# Patient Record
Sex: Female | Born: 1950 | Race: White | Hispanic: No | Marital: Married | Smoking: Never smoker
Health system: Southern US, Community
[De-identification: ages and names within clinical notes are randomized; demographics above are authoritative.]

## PROBLEM LIST (undated history)

## (undated) DIAGNOSIS — K219 Gastro-esophageal reflux disease without esophagitis: Secondary | ICD-10-CM

## (undated) DIAGNOSIS — I10 Essential (primary) hypertension: Secondary | ICD-10-CM

## (undated) DIAGNOSIS — I499 Cardiac arrhythmia, unspecified: Secondary | ICD-10-CM

## (undated) DIAGNOSIS — Z9889 Other specified postprocedural states: Secondary | ICD-10-CM

## (undated) DIAGNOSIS — R112 Nausea with vomiting, unspecified: Secondary | ICD-10-CM

## (undated) DIAGNOSIS — M1990 Unspecified osteoarthritis, unspecified site: Secondary | ICD-10-CM

## (undated) DIAGNOSIS — R51 Headache: Secondary | ICD-10-CM

## (undated) HISTORY — PX: ABDOMINAL HYSTERECTOMY: SHX81

## (undated) HISTORY — PX: COLONOSCOPY W/ BIOPSIES AND POLYPECTOMY: SHX1376

## (undated) HISTORY — PX: BREAST SURGERY: SHX581

## (undated) HISTORY — PX: BLADDER SUSPENSION: SHX72

---

## 2014-01-18 ENCOUNTER — Other Ambulatory Visit: Payer: Self-pay | Admitting: Orthopedic Surgery

## 2014-01-26 ENCOUNTER — Encounter (HOSPITAL_COMMUNITY): Payer: Self-pay

## 2014-01-29 ENCOUNTER — Encounter (HOSPITAL_COMMUNITY)
Admission: RE | Admit: 2014-01-29 | Discharge: 2014-01-29 | Disposition: A | Discharge status: Home / Self Care | Payer: Managed Care, Other (non HMO) | Source: Ambulatory Visit | Attending: Orthopedic Surgery | Admitting: Orthopedic Surgery

## 2014-01-29 ENCOUNTER — Encounter (HOSPITAL_COMMUNITY): Payer: Self-pay

## 2014-01-29 DIAGNOSIS — Z01818 Encounter for other preprocedural examination: Secondary | ICD-10-CM | POA: Insufficient documentation

## 2014-01-29 DIAGNOSIS — Z01812 Encounter for preprocedural laboratory examination: Secondary | ICD-10-CM | POA: Insufficient documentation

## 2014-01-29 HISTORY — DX: Other specified postprocedural states: Z98.890

## 2014-01-29 HISTORY — DX: Essential (primary) hypertension: I10

## 2014-01-29 HISTORY — DX: Unspecified osteoarthritis, unspecified site: M19.90

## 2014-01-29 HISTORY — DX: Headache: R51

## 2014-01-29 HISTORY — DX: Nausea with vomiting, unspecified: R11.2

## 2014-01-29 LAB — COMPREHENSIVE METABOLIC PANEL
ALT: 25 U/L (ref 0–35)
AST: 22 U/L (ref 0–37)
Albumin: 3.9 g/dL (ref 3.5–5.2)
Alkaline Phosphatase: 91 U/L (ref 39–117)
BUN: 18 mg/dL (ref 6–23)
CALCIUM: 9.7 mg/dL (ref 8.4–10.5)
CO2: 26 meq/L (ref 19–32)
CREATININE: 0.56 mg/dL (ref 0.50–1.10)
Chloride: 102 mEq/L (ref 96–112)
GFR calc Af Amer: >90 mL/min (ref >90)
Glucose, Bld: 80 mg/dL (ref 70–99)
Potassium: 4.2 mEq/L (ref 3.7–5.3)
Sodium: 144 mEq/L (ref 137–147)
TOTAL PROTEIN: 7.1 g/dL (ref 6.0–8.3)
Total Bilirubin: 0.3 mg/dL (ref 0.3–1.2)

## 2014-01-29 LAB — CBC WITH DIFFERENTIAL/PLATELET
BASOS ABS: 0 10*3/uL (ref 0.0–0.1)
Basophils Relative: 0 % (ref 0–1)
EOS PCT: 2 % (ref 0–5)
Eosinophils Absolute: 0.1 10*3/uL (ref 0.0–0.7)
HEMATOCRIT: 41.4 % (ref 36.0–46.0)
Hemoglobin: 14.2 g/dL (ref 12.0–15.0)
LYMPHS PCT: 42 % (ref 12–46)
Lymphs Abs: 3.1 10*3/uL (ref 0.7–4.0)
MCH: 30.1 pg (ref 26.0–34.0)
MCHC: 34.3 g/dL (ref 30.0–36.0)
MCV: 87.9 fL (ref 78.0–100.0)
Monocytes Absolute: 0.6 10*3/uL (ref 0.1–1.0)
Monocytes Relative: 8 % (ref 3–12)
Neutro Abs: 3.6 10*3/uL (ref 1.7–7.7)
Neutrophils Relative %: 48 % (ref 43–77)
PLATELETS: 284 10*3/uL (ref 150–400)
RBC: 4.71 MIL/uL (ref 3.87–5.11)
RDW: 13.3 % (ref 11.5–15.5)
WBC: 7.4 10*3/uL (ref 4.0–10.5)

## 2014-01-29 LAB — URINALYSIS, ROUTINE W REFLEX MICROSCOPIC
Bilirubin Urine: NEGATIVE
GLUCOSE, UA: NEGATIVE mg/dL
Hgb urine dipstick: NEGATIVE
KETONES UR: NEGATIVE mg/dL
LEUKOCYTES UA: NEGATIVE
NITRITE: NEGATIVE
PH: 6 (ref 5.0–8.0)
Protein, ur: NEGATIVE mg/dL
Specific Gravity, Urine: 1.023 (ref 1.005–1.030)
Urobilinogen, UA: 0.2 mg/dL (ref 0.0–1.0)

## 2014-01-29 LAB — PROTIME-INR
INR: 0.99 (ref 0.00–1.49)
Prothrombin Time: 12.9 seconds (ref 11.6–15.2)

## 2014-01-29 LAB — ABO/RH: ABO/RH(D): O POS

## 2014-01-29 LAB — TYPE AND SCREEN
ABO/RH(D): O POS
Antibody Screen: NEGATIVE

## 2014-01-29 LAB — SURGICAL PCR SCREEN
MRSA, PCR: NEGATIVE
Staphylococcus aureus: NEGATIVE

## 2014-01-29 LAB — APTT: aPTT: 28 seconds (ref 24–37)

## 2014-01-29 NOTE — Progress Notes (Signed)
01/29/14 1513  OBSTRUCTIVE SLEEP APNEA  Have you ever been diagnosed with sleep apnea through a sleep study? No  Do you snore loudly (loud enough to be heard through closed doors)?  1  Do you often feel tired, fatigued, or sleepy during the daytime? 0  Has anyone observed you stop breathing during your sleep? 0  Do you have, or are you being treated for high blood pressure? 1  BMI more than 35 kg/m2? 1  Age over 62 years old? 1  Neck circumference greater than 40 cm/18 inches? 0  Gender: 0  Obstructive Sleep Apnea Score 4  Score 4 or greater  Results sent to PCP

## 2014-01-29 NOTE — Pre-Procedure Instructions (Signed)
Heather Mueller  01/29/2014   Your procedure is scheduled on: Monday, February 08, 2014 at 8:45 AM  Report to [facility] Short Stay (use Main Entrance "A'') at 6:45 AM.  Call this number if you have problems the morning of surgery: [phone]   Remember:   Do not eat food or drink liquids after midnight.   Take these medicines the morning of surgery with A SIP OF WATER: amLODipine (NORVASC)   Stop taking Aspirin, vitamins and herbal medications ( Glucosamine)  Do not take any NSAIDs ie: Ibuprofen, Advil, Naproxen or any medication containing Aspirin. Stop 5 days prior to procedure, Wednesday, 02/03/13   Do not wear jewelry, make-up or nail polish.  Do not wear lotions, powders, or perfumes. You may wear deodorant.  Do not shave 48 hours prior to surgery.  Do not bring valuables to the hospital.  [facility] is not responsible for any belongings or valuables.               Contacts, dentures or bridgework may not be worn into surgery.  Leave suitcase in the car. After surgery it may be brought to your room.  For patients admitted to the hospital, discharge time is determined by your treatment team.               Patients discharged the day of surgery will not be allowed to drive home.  Name and phone number of your driver:   Special Instructions:  Special Instructions:Special Instructions: [facility] - Preparing for Surgery  Before surgery, you can play an important role.  Because skin is not sterile, your skin needs to be as free of germs as possible.  You can reduce the number of germs on you skin by washing with CHG (chlorahexidine gluconate) soap before surgery.  CHG is an antiseptic cleaner which kills germs and bonds with the skin to continue killing germs even after washing.  Please DO NOT use if you have an allergy to CHG or antibacterial soaps.  If your skin becomes reddened/irritated stop using the CHG and inform your nurse when you arrive at Short Stay.  Do not shave (including  legs and underarms) for at least 48 hours prior to the first CHG shower.  You may shave your face.  Please follow these instructions carefully:   1.  Shower with CHG Soap the night before surgery and the morning of Surgery.  2.  If you choose to wash your hair, wash your hair first as usual with your normal shampoo.  3.  After you shampoo, rinse your hair and body thoroughly to remove the Shampoo.  4.  Use CHG as you would any other liquid soap.  You can apply chg directly  to the skin and wash gently with scrungie or a clean washcloth.  5.  Apply the CHG Soap to your body ONLY FROM THE NECK DOWN.  Do not use on open wounds or open sores.  Avoid contact with your eyes, ears, mouth and genitals (private parts).  Wash genitals (private parts) with your normal soap.  6.  Wash thoroughly, paying special attention to the area where your surgery will be performed.  7.  Thoroughly rinse your body with warm water from the neck down.  8.  DO NOT shower/wash with your normal soap after using and rinsing off the CHG Soap.  9.  Pat yourself dry with a clean towel.            10.  Wear clean  pajamas.            11.  Place clean sheets on your bed the night of your first shower and do not sleep with pets.  Day of Surgery  Do not apply any lotions the morning of surgery.  Please wear clean clothes to the hospital/surgery center.   Please read over the following fact sheets that you were given: Pain Booklet, Coughing and Deep Breathing, Blood Transfusion Information, Total Joint Packet, MRSA Information and Surgical Site Infection Prevention

## 2014-01-30 LAB — URINE CULTURE: Colony Count: 55000

## 2014-02-07 MED ORDER — TRANEXAMIC ACID 100 MG/ML IV SOLN
1000.0000 mg | INTRAVENOUS | Status: AC
  Administered 2014-02-08: 1000 mg via INTRAVENOUS
  Filled 2014-02-07 (×2): qty 10

## 2014-02-07 MED ORDER — CHLORHEXIDINE GLUCONATE 4 % EX LIQD
60.0000 mL | Freq: Once | CUTANEOUS | Status: DC
  Filled 2014-02-07: qty 60

## 2014-02-07 MED ORDER — VANCOMYCIN HCL 10 G IV SOLR
1500.0000 mg | INTRAVENOUS | Status: DC
  Filled 2014-02-07: qty 1500

## 2014-02-07 MED ORDER — BUPIVACAINE LIPOSOME 1.3 % IJ SUSP
20.0000 mL | Freq: Once | INTRAMUSCULAR | Status: DC
  Filled 2014-02-07 (×2): qty 20

## 2014-02-08 ENCOUNTER — Encounter (HOSPITAL_COMMUNITY): Payer: Self-pay | Admitting: Certified Registered Nurse Anesthetist

## 2014-02-08 ENCOUNTER — Encounter (HOSPITAL_COMMUNITY): Payer: Managed Care, Other (non HMO) | Admitting: Certified Registered Nurse Anesthetist

## 2014-02-08 ENCOUNTER — Inpatient Hospital Stay (HOSPITAL_COMMUNITY): Payer: Managed Care, Other (non HMO) | Admitting: Certified Registered Nurse Anesthetist

## 2014-02-08 ENCOUNTER — Encounter (HOSPITAL_COMMUNITY)
Admission: RE | Disposition: A | Discharge status: Home Health | Payer: Self-pay | Source: Ambulatory Visit | Attending: Orthopedic Surgery

## 2014-02-08 ENCOUNTER — Inpatient Hospital Stay (HOSPITAL_COMMUNITY)
Admission: RE | Admit: 2014-02-08 | Discharge: 2014-02-10 | DRG: 470 | Disposition: A | Discharge status: Home Health | Payer: Managed Care, Other (non HMO) | Source: Ambulatory Visit | Attending: Orthopedic Surgery | Admitting: Orthopedic Surgery

## 2014-02-08 DIAGNOSIS — M1710 Unilateral primary osteoarthritis, unspecified knee: Principal | ICD-10-CM | POA: Diagnosis present

## 2014-02-08 DIAGNOSIS — Z8249 Family history of ischemic heart disease and other diseases of the circulatory system: Secondary | ICD-10-CM

## 2014-02-08 DIAGNOSIS — Z96659 Presence of unspecified artificial knee joint: Secondary | ICD-10-CM

## 2014-02-08 DIAGNOSIS — Z8349 Family history of other endocrine, nutritional and metabolic diseases: Secondary | ICD-10-CM

## 2014-02-08 DIAGNOSIS — I10 Essential (primary) hypertension: Secondary | ICD-10-CM | POA: Diagnosis present

## 2014-02-08 DIAGNOSIS — D62 Acute posthemorrhagic anemia: Secondary | ICD-10-CM | POA: Diagnosis not present

## 2014-02-08 HISTORY — PX: TOTAL KNEE ARTHROPLASTY: SHX125

## 2014-02-08 LAB — CBC
HEMATOCRIT: 39.8 % (ref 36.0–46.0)
Hemoglobin: 13.8 g/dL (ref 12.0–15.0)
MCH: 30.4 pg (ref 26.0–34.0)
MCHC: 34.7 g/dL (ref 30.0–36.0)
MCV: 87.7 fL (ref 78.0–100.0)
Platelets: 259 10*3/uL (ref 150–400)
RBC: 4.54 MIL/uL (ref 3.87–5.11)
RDW: 13.4 % (ref 11.5–15.5)
WBC: 13.3 10*3/uL — AB (ref 4.0–10.5)

## 2014-02-08 LAB — CREATININE, SERUM
Creatinine, Ser: 0.5 mg/dL (ref 0.50–1.10)
GFR calc non Af Amer: >90 mL/min (ref >90)

## 2014-02-08 SURGERY — ARTHROPLASTY, KNEE, TOTAL
Anesthesia: Regional | Site: Knee | Laterality: Right

## 2014-02-08 MED ORDER — FENTANYL CITRATE 0.05 MG/ML IJ SOLN
INTRAMUSCULAR | Status: DC | PRN
  Administered 2014-02-08 (×10): 25 ug via INTRAVENOUS
  Administered 2014-02-08: 50 ug via INTRAVENOUS

## 2014-02-08 MED ORDER — DOCUSATE SODIUM 100 MG PO CAPS
100.0000 mg | ORAL_CAPSULE | Freq: Two times a day (BID) | ORAL | Status: DC
  Administered 2014-02-08 – 2014-02-10 (×4): 100 mg via ORAL
  Filled 2014-02-08 (×4): qty 1

## 2014-02-08 MED ORDER — SODIUM CHLORIDE 0.9 % IV SOLN: INTRAVENOUS | Status: DC

## 2014-02-08 MED ORDER — DIPHENHYDRAMINE HCL 12.5 MG/5ML PO ELIX
12.5000 mg | ORAL_SOLUTION | ORAL | Status: DC | PRN
Start: 2014-02-08 — End: 2014-02-10

## 2014-02-08 MED ORDER — CELECOXIB 200 MG PO CAPS
200.0000 mg | ORAL_CAPSULE | Freq: Two times a day (BID) | ORAL | Status: DC
  Administered 2014-02-08 – 2014-02-10 (×5): 200 mg via ORAL
  Filled 2014-02-08 (×7): qty 1

## 2014-02-08 MED ORDER — ONDANSETRON HCL 4 MG/2ML IJ SOLN
4.0000 mg | Freq: Four times a day (QID) | INTRAMUSCULAR | Status: DC | PRN
  Administered 2014-02-08: 4 mg via INTRAVENOUS
  Filled 2014-02-08: qty 2

## 2014-02-08 MED ORDER — ENOXAPARIN SODIUM 30 MG/0.3ML ~~LOC~~ SOLN
30.0000 mg | Freq: Two times a day (BID) | SUBCUTANEOUS | Status: DC
  Administered 2014-02-09 – 2014-02-10 (×3): 30 mg via SUBCUTANEOUS
  Filled 2014-02-08 (×5): qty 0.3

## 2014-02-08 MED ORDER — SODIUM CHLORIDE 0.9 % IV SOLN
1000.0000 mg | INTRAVENOUS | Status: DC | PRN
  Administered 2014-02-08: 1500 mg via INTRAVENOUS

## 2014-02-08 MED ORDER — ONDANSETRON HCL 4 MG/2ML IJ SOLN
INTRAMUSCULAR | Status: DC | PRN
  Administered 2014-02-08 (×2): 4 mg via INTRAVENOUS

## 2014-02-08 MED ORDER — ARTIFICIAL TEARS OP OINT
TOPICAL_OINTMENT | OPHTHALMIC | Status: AC
  Filled 2014-02-08: qty 3.5

## 2014-02-08 MED ORDER — HYDROMORPHONE HCL PF 1 MG/ML IJ SOLN
1.0000 mg | INTRAMUSCULAR | Status: DC | PRN
  Administered 2014-02-08 – 2014-02-09 (×3): 1 mg via INTRAVENOUS
  Filled 2014-02-08 (×3): qty 1

## 2014-02-08 MED ORDER — ONDANSETRON HCL 4 MG/2ML IJ SOLN
INTRAMUSCULAR | Status: AC
  Filled 2014-02-08: qty 2

## 2014-02-08 MED ORDER — ACETAMINOPHEN 325 MG PO TABS: 650.0000 mg | ORAL_TABLET | Freq: Four times a day (QID) | ORAL | Status: DC | PRN

## 2014-02-08 MED ORDER — OXYCODONE HCL ER 10 MG PO T12A
10.0000 mg | EXTENDED_RELEASE_TABLET | Freq: Two times a day (BID) | ORAL | Status: DC
  Administered 2014-02-08 – 2014-02-10 (×5): 10 mg via ORAL
  Filled 2014-02-08 (×5): qty 1

## 2014-02-08 MED ORDER — SODIUM CHLORIDE 0.9 % IR SOLN
Status: DC | PRN
  Administered 2014-02-08: 1

## 2014-02-08 MED ORDER — PROPOFOL 10 MG/ML IV BOLUS
INTRAVENOUS | Status: AC
  Filled 2014-02-08: qty 20

## 2014-02-08 MED ORDER — DEXAMETHASONE SODIUM PHOSPHATE 4 MG/ML IJ SOLN
INTRAMUSCULAR | Status: DC | PRN
  Administered 2014-02-08: 8 mg via INTRAVENOUS

## 2014-02-08 MED ORDER — ROCURONIUM BROMIDE 50 MG/5ML IV SOLN
INTRAVENOUS | Status: AC
  Filled 2014-02-08: qty 1

## 2014-02-08 MED ORDER — MENTHOL 3 MG MT LOZG: 1.0000 | LOZENGE | OROMUCOSAL | Status: DC | PRN

## 2014-02-08 MED ORDER — METOCLOPRAMIDE HCL 10 MG PO TABS: 5.0000 mg | ORAL_TABLET | Freq: Three times a day (TID) | ORAL | Status: DC | PRN

## 2014-02-08 MED ORDER — BISACODYL 5 MG PO TBEC: 5.0000 mg | DELAYED_RELEASE_TABLET | Freq: Every day | ORAL | Status: DC | PRN

## 2014-02-08 MED ORDER — OXYCODONE HCL 5 MG/5ML PO SOLN: 5.0000 mg | Freq: Once | ORAL | Status: DC | PRN

## 2014-02-08 MED ORDER — FLEET ENEMA 7-19 GM/118ML RE ENEM: 1.0000 | ENEMA | Freq: Once | RECTAL | Status: AC | PRN

## 2014-02-08 MED ORDER — ALUM & MAG HYDROXIDE-SIMETH 200-200-20 MG/5ML PO SUSP: 30.0000 mL | ORAL | Status: DC | PRN

## 2014-02-08 MED ORDER — VANCOMYCIN HCL IN DEXTROSE 1-5 GM/200ML-% IV SOLN
1000.0000 mg | Freq: Two times a day (BID) | INTRAVENOUS | Status: AC
  Administered 2014-02-08: 1000 mg via INTRAVENOUS
  Filled 2014-02-08: qty 200

## 2014-02-08 MED ORDER — PROPOFOL 10 MG/ML IV BOLUS
INTRAVENOUS | Status: DC | PRN
  Administered 2014-02-08: 150 mg via INTRAVENOUS

## 2014-02-08 MED ORDER — METOCLOPRAMIDE HCL 5 MG/ML IJ SOLN: 5.0000 mg | Freq: Three times a day (TID) | INTRAMUSCULAR | Status: DC | PRN

## 2014-02-08 MED ORDER — ONDANSETRON HCL 4 MG PO TABS
4.0000 mg | ORAL_TABLET | Freq: Four times a day (QID) | ORAL | Status: DC | PRN
  Administered 2014-02-10: 4 mg via ORAL
  Filled 2014-02-08: qty 1

## 2014-02-08 MED ORDER — SENNOSIDES-DOCUSATE SODIUM 8.6-50 MG PO TABS: 1.0000 | ORAL_TABLET | Freq: Every evening | ORAL | Status: DC | PRN

## 2014-02-08 MED ORDER — OXYCODONE HCL 5 MG PO TABS
5.0000 mg | ORAL_TABLET | ORAL | Status: DC | PRN
  Administered 2014-02-08 (×3): 5 mg via ORAL
  Administered 2014-02-09 – 2014-02-10 (×7): 10 mg via ORAL
  Filled 2014-02-08 (×5): qty 2
  Filled 2014-02-08 (×3): qty 1
  Filled 2014-02-08 (×2): qty 2

## 2014-02-08 MED ORDER — FENTANYL CITRATE 0.05 MG/ML IJ SOLN
INTRAMUSCULAR | Status: AC
  Filled 2014-02-08: qty 5

## 2014-02-08 MED ORDER — MIDAZOLAM HCL 2 MG/2ML IJ SOLN
INTRAMUSCULAR | Status: AC
  Filled 2014-02-08: qty 2

## 2014-02-08 MED ORDER — SUCCINYLCHOLINE CHLORIDE 20 MG/ML IJ SOLN
INTRAMUSCULAR | Status: AC
  Filled 2014-02-08: qty 1

## 2014-02-08 MED ORDER — ACETAMINOPHEN 650 MG RE SUPP: 650.0000 mg | Freq: Four times a day (QID) | RECTAL | Status: DC | PRN

## 2014-02-08 MED ORDER — ROPIVACAINE HCL 5 MG/ML IJ SOLN
INTRAMUSCULAR | Status: DC | PRN
  Administered 2014-02-08: 30 mL via PERINEURAL

## 2014-02-08 MED ORDER — METHOCARBAMOL 500 MG PO TABS
500.0000 mg | ORAL_TABLET | Freq: Four times a day (QID) | ORAL | Status: DC | PRN
  Administered 2014-02-08 – 2014-02-09 (×3): 500 mg via ORAL
  Filled 2014-02-08 (×4): qty 1

## 2014-02-08 MED ORDER — LOSARTAN POTASSIUM 50 MG PO TABS
50.0000 mg | ORAL_TABLET | Freq: Two times a day (BID) | ORAL | Status: DC
  Administered 2014-02-08 – 2014-02-10 (×4): 50 mg via ORAL
  Filled 2014-02-08 (×6): qty 1

## 2014-02-08 MED ORDER — AMLODIPINE BESYLATE 5 MG PO TABS
5.0000 mg | ORAL_TABLET | Freq: Every day | ORAL | Status: DC
  Administered 2014-02-09 – 2014-02-10 (×2): 5 mg via ORAL
  Filled 2014-02-08 (×2): qty 1

## 2014-02-08 MED ORDER — OXYCODONE HCL 5 MG PO TABS: 5.0000 mg | ORAL_TABLET | Freq: Once | ORAL | Status: DC | PRN

## 2014-02-08 MED ORDER — FENTANYL CITRATE 0.05 MG/ML IJ SOLN
INTRAMUSCULAR | Status: AC
  Administered 2014-02-08: 100 ug
  Filled 2014-02-08: qty 2

## 2014-02-08 MED ORDER — STERILE WATER FOR INJECTION IJ SOLN
INTRAMUSCULAR | Status: AC
  Filled 2014-02-08: qty 10

## 2014-02-08 MED ORDER — LACTATED RINGERS IV SOLN
INTRAVENOUS | Status: DC
  Administered 2014-02-08 (×2): via INTRAVENOUS

## 2014-02-08 MED ORDER — DEXAMETHASONE SODIUM PHOSPHATE 4 MG/ML IJ SOLN
INTRAMUSCULAR | Status: AC
  Filled 2014-02-08: qty 2

## 2014-02-08 MED ORDER — BUPIVACAINE-EPINEPHRINE 0.5% -1:200000 IJ SOLN
INTRAMUSCULAR | Status: DC | PRN
  Administered 2014-02-08: 30 mL

## 2014-02-08 MED ORDER — BUPIVACAINE LIPOSOME 1.3 % IJ SUSP
INTRAMUSCULAR | Status: DC | PRN
  Administered 2014-02-08: 20 mL

## 2014-02-08 MED ORDER — LIDOCAINE HCL (CARDIAC) 20 MG/ML IV SOLN
INTRAVENOUS | Status: DC | PRN
  Administered 2014-02-08: 40 mg via INTRAVENOUS

## 2014-02-08 MED ORDER — EPHEDRINE SULFATE 50 MG/ML IJ SOLN
INTRAMUSCULAR | Status: AC
  Filled 2014-02-08: qty 1

## 2014-02-08 MED ORDER — PHENOL 1.4 % MT LIQD: 1.0000 | OROMUCOSAL | Status: DC | PRN

## 2014-02-08 MED ORDER — HYDROMORPHONE HCL PF 1 MG/ML IJ SOLN: 0.2500 mg | INTRAMUSCULAR | Status: DC | PRN

## 2014-02-08 MED ORDER — METHOCARBAMOL 100 MG/ML IJ SOLN: 500.0000 mg | Freq: Four times a day (QID) | INTRAMUSCULAR | Status: DC | PRN

## 2014-02-08 MED ORDER — MIDAZOLAM HCL 5 MG/5ML IJ SOLN
INTRAMUSCULAR | Status: DC | PRN
  Administered 2014-02-08: 2 mg via INTRAVENOUS

## 2014-02-08 MED ORDER — LIDOCAINE HCL (CARDIAC) 20 MG/ML IV SOLN
INTRAVENOUS | Status: AC
  Filled 2014-02-08: qty 5

## 2014-02-08 MED ORDER — BUPIVACAINE-EPINEPHRINE (PF) 0.5% -1:200000 IJ SOLN
INTRAMUSCULAR | Status: AC
  Filled 2014-02-08: qty 10

## 2014-02-08 MED ORDER — MIDAZOLAM HCL 2 MG/2ML IJ SOLN
INTRAMUSCULAR | Status: AC
  Administered 2014-02-08: 2 mg
  Filled 2014-02-08: qty 2

## 2014-02-08 SURGICAL SUPPLY — 61 items
BANDAGE ESMARK 6X9 LF (GAUZE/BANDAGES/DRESSINGS) ×1 IMPLANT
BLADE PATELLA REAM PILOT HOLE (BLADE) ×3 IMPLANT
BLADE SAGITTAL 13X1.27X60 (BLADE) ×2 IMPLANT
BLADE SAGITTAL 13X1.27X60MM (BLADE) ×1
BLADE SAW SGTL 83.5X18.5 (BLADE) ×3 IMPLANT
BNDG ESMARK 6X9 LF (GAUZE/BANDAGES/DRESSINGS) ×3
BOWL SMART MIX CTS (DISPOSABLE) ×3 IMPLANT
CAP POR NKTM CP VIT E LN CER ×3 IMPLANT
CEMENT BONE SIMPLEX SPEEDSET (Cement) ×6 IMPLANT
COVER SURGICAL LIGHT HANDLE (MISCELLANEOUS) ×3 IMPLANT
CUFF TOURNIQUET SINGLE 34IN LL (TOURNIQUET CUFF) ×3 IMPLANT
DRAPE EXTREMITY T 121X128X90 (DRAPE) ×3 IMPLANT
DRAPE INCISE IOBAN 66X45 STRL (DRAPES) ×6 IMPLANT
DRAPE PROXIMA HALF (DRAPES) ×3 IMPLANT
DRAPE U-SHAPE 47X51 STRL (DRAPES) ×3 IMPLANT
DRSG ADAPTIC 3X8 NADH LF (GAUZE/BANDAGES/DRESSINGS) ×3 IMPLANT
DRSG PAD ABDOMINAL 8X10 ST (GAUZE/BANDAGES/DRESSINGS) ×3 IMPLANT
DURAPREP 26ML APPLICATOR (WOUND CARE) ×6 IMPLANT
ELECT REM PT RETURN 9FT ADLT (ELECTROSURGICAL) ×3
ELECTRODE REM PT RTRN 9FT ADLT (ELECTROSURGICAL) ×1 IMPLANT
EVACUATOR 1/8 PVC DRAIN (DRAIN) ×3 IMPLANT
GLOVE BIOGEL M 7.0 STRL (GLOVE) IMPLANT
GLOVE BIOGEL M STRL SZ7.5 (GLOVE) ×3 IMPLANT
GLOVE BIOGEL PI IND STRL 7.0 (GLOVE) ×4 IMPLANT
GLOVE BIOGEL PI IND STRL 7.5 (GLOVE) IMPLANT
GLOVE BIOGEL PI IND STRL 8.5 (GLOVE) ×2 IMPLANT
GLOVE BIOGEL PI INDICATOR 7.0 (GLOVE) ×8
GLOVE BIOGEL PI INDICATOR 7.5 (GLOVE)
GLOVE BIOGEL PI INDICATOR 8.5 (GLOVE) ×4
GLOVE SURG ORTHO 8.0 STRL STRW (GLOVE) ×6 IMPLANT
GLOVE SURG SS PI 7.0 STRL IVOR (GLOVE) ×9 IMPLANT
GOWN STRL REUS W/ TWL LRG LVL3 (GOWN DISPOSABLE) ×2 IMPLANT
GOWN STRL REUS W/ TWL XL LVL3 (GOWN DISPOSABLE) ×2 IMPLANT
GOWN STRL REUS W/TWL LRG LVL3 (GOWN DISPOSABLE) ×4
GOWN STRL REUS W/TWL XL LVL3 (GOWN DISPOSABLE) ×4
HANDPIECE INTERPULSE COAX TIP (DISPOSABLE) ×2
HOOD PEEL AWAY FACE SHEILD DIS (HOOD) ×12 IMPLANT
KIT BASIN OR (CUSTOM PROCEDURE TRAY) ×3 IMPLANT
KIT ROOM TURNOVER OR (KITS) ×3 IMPLANT
MANIFOLD NEPTUNE II (INSTRUMENTS) ×3 IMPLANT
NEEDLE 22X1 1/2 (OR ONLY) (NEEDLE) ×6 IMPLANT
NS IRRIG 1000ML POUR BTL (IV SOLUTION) ×3 IMPLANT
PACK TOTAL JOINT (CUSTOM PROCEDURE TRAY) ×3 IMPLANT
PAD ARMBOARD 7.5X6 YLW CONV (MISCELLANEOUS) ×6 IMPLANT
PADDING CAST COTTON 6X4 STRL (CAST SUPPLIES) ×3 IMPLANT
SET HNDPC FAN SPRY TIP SCT (DISPOSABLE) ×1 IMPLANT
SPONGE GAUZE 4X4 12PLY (GAUZE/BANDAGES/DRESSINGS) ×3 IMPLANT
SPONGE GAUZE 4X4 12PLY STER LF (GAUZE/BANDAGES/DRESSINGS) ×3 IMPLANT
STAPLER VISISTAT 35W (STAPLE) ×3 IMPLANT
SUCTION FRAZIER TIP 10 FR DISP (SUCTIONS) ×3 IMPLANT
SUT BONE WAX W31G (SUTURE) ×3 IMPLANT
SUT VIC AB 0 CTB1 27 (SUTURE) ×6 IMPLANT
SUT VIC AB 1 CT1 27 (SUTURE) ×4
SUT VIC AB 1 CT1 27XBRD ANBCTR (SUTURE) ×2 IMPLANT
SUT VIC AB 2-0 CT1 27 (SUTURE) ×4
SUT VIC AB 2-0 CT1 TAPERPNT 27 (SUTURE) ×2 IMPLANT
SYR CONTROL 10ML LL (SYRINGE) ×6 IMPLANT
TOWEL OR 17X24 6PK STRL BLUE (TOWEL DISPOSABLE) ×3 IMPLANT
TOWEL OR 17X26 10 PK STRL BLUE (TOWEL DISPOSABLE) ×3 IMPLANT
TRAY FOLEY CATH 14FR (SET/KITS/TRAYS/PACK) ×3 IMPLANT
WATER STERILE IRR 1000ML POUR (IV SOLUTION) ×6 IMPLANT

## 2014-02-08 NOTE — Progress Notes (Signed)
Orthopedic Tech Progress Note Patient Details:  Heather Mueller Jul 04, 1951 [id number] CPM applied to RLE with appropriate settings. OHF applied to bed. Footsie roll provided. CPM Right Knee CPM Right Knee: On Right Knee Flexion (Degrees): 90 Right Knee Extension (Degrees): 0   [name] 02/08/2014, 11:29 AM

## 2014-02-08 NOTE — Anesthesia Postprocedure Evaluation (Signed)
  Anesthesia Post-op Note  Patient: Heather BottomDawn Mathena  Procedure(s) Performed: Procedure(s): RIGHT TOTAL KNEE ARTHROPLASTY (Right)  Patient Location: PACU  Anesthesia Type:General and block  Level of Consciousness: awake and alert   Airway and Oxygen Therapy: Patient Spontanous Breathing  Post-op Pain: none  Post-op Assessment: Post-op Vital signs reviewed, Patient's Cardiovascular Status Stable and Respiratory Function Stable  Post-op Vital Signs: Reviewed  Filed Vitals:   02/08/14 1124  BP: 146/81  Pulse: 88  Temp:   Resp: 11    Complications: No apparent anesthesia complications

## 2014-02-08 NOTE — Transfer of Care (Signed)
Immediate Anesthesia Transfer of Care Note  Patient: Heather Mueller  Procedure(s) Performed: Procedure(s): RIGHT TOTAL KNEE ARTHROPLASTY (Right)  Patient Location: PACU  Anesthesia Type:General, regional for post op pain control  Level of Consciousness: awake, alert  and oriented  Airway & Oxygen Therapy: Patient Spontanous Breathing and Patient connected to nasal cannula oxygen  Post-op Assessment: Report given to PACU RN and Post -op Vital signs reviewed and stable  Post vital signs: Reviewed and stable  Complications: No apparent anesthesia complications

## 2014-02-08 NOTE — Anesthesia Procedure Notes (Addendum)
Anesthesia Regional Block:  Femoral nerve block  Pre-Anesthetic Checklist: ,, timeout performed, Correct Patient, Correct Site, Correct Laterality, Correct Procedure, Correct Position, site marked, Risks and benefits discussed, pre-op evaluation,  At surgeon's request and post-op pain management  Laterality: Right  Prep: Maximum Sterile Barrier Precautions used and chloraprep       Needles:  Injection technique: Single-shot  Needle Type: Echogenic Stimulator Needle     Needle Length: 5cm 5 cm Needle Gauge: 22 and 22 G    Additional Needles:  Procedures: ultrasound guided (picture in chart) Femoral nerve block  Nerve Stimulator or Paresthesia:  Response: Patellar respose,   Additional Responses:   Narrative:  Start time: 02/08/2014 8:10 AM End time: 02/08/2014 8:19 AM Injection made incrementally with aspirations every 5 mL. Anesthesiologist: Fitzgerald,MD  Additional Notes: 2% Lidocaine skin wheel.    Procedure Name: LMA Insertion Date/Time: 02/08/2014 8:42 AM Performed by: Margaree MackintoshYACOUB, Jacey Pelc B Pre-anesthesia Checklist: Patient identified, Timeout performed, Emergency Drugs available, Suction available and Patient being monitored Patient Re-evaluated:Patient Re-evaluated prior to inductionOxygen Delivery Method: Circle system utilized Preoxygenation: Pre-oxygenation with 100% oxygen Intubation Type: IV induction LMA: LMA inserted LMA Size: 4.0 Number of attempts: 1 Placement Confirmation: positive ETCO2 and breath sounds checked- equal and bilateral Tube secured with: Tape Dental Injury: Teeth and Oropharynx as per pre-operative assessment

## 2014-02-08 NOTE — Progress Notes (Signed)
Utilization review completed.  

## 2014-02-08 NOTE — Anesthesia Preprocedure Evaluation (Addendum)
Anesthesia Evaluation  Patient identified by MRN, date of birth, ID band Patient awake    Reviewed: Allergy & Precautions, H&P , NPO status , Patient's Chart, lab work & pertinent test results  History of Anesthesia Complications (+) PONVNegative for: history of anesthetic complications  Airway Mallampati: II TM Distance: >3 FB Neck ROM: Full    Dental no notable dental hx. (+) Teeth Intact, Dental Advisory Given   Pulmonary neg pulmonary ROS,  breath sounds clear to auscultation  Pulmonary exam normal       Cardiovascular hypertension, On Medications Rhythm:Regular Rate:Normal     Neuro/Psych  Headaches, negative psych ROS   GI/Hepatic negative GI ROS, Neg liver ROS,   Endo/Other  Morbid obesity  Renal/GU negative Renal ROS  negative genitourinary   Musculoskeletal   Abdominal   Peds  Hematology negative hematology ROS (+)   Anesthesia Other Findings   Reproductive/Obstetrics negative OB ROS                          Anesthesia Physical Anesthesia Plan  ASA: III  Anesthesia Plan: General and Regional   Post-op Pain Management:    Induction: Intravenous  Airway Management Planned: LMA  Additional Equipment:   Intra-op Plan:   Post-operative Plan: Extubation in OR  Informed Consent: I have reviewed the patients History and Physical, chart, labs and discussed the procedure including the risks, benefits and alternatives for the proposed anesthesia with the patient or authorized representative who has indicated his/her understanding and acceptance.   Dental advisory given  Plan Discussed with: CRNA  Anesthesia Plan Comments:         Anesthesia Quick Evaluation

## 2014-02-08 NOTE — Op Note (Signed)
TOTAL KNEE REPLACEMENT OPERATIVE NOTE:  02/08/2014  1:37 PM  PATIENT:  Heather Mueller  63 y.o. female  PRE-OPERATIVE DIAGNOSIS:  osteoarthritis right knee  POST-OPERATIVE DIAGNOSIS:  osteoarthritis right knee  PROCEDURE:  Procedure(s): RIGHT TOTAL KNEE ARTHROPLASTY  SURGEON:  Surgeon(s): Dannielle HuhSteve Almus Woodham, MD  PHYSICIAN ASSISTANT: Altamese CabalMaurice Jones, [employer] [facility]  ANESTHESIA:   general  DRAINS: Hemovac  SPECIMEN: None  COUNTS:  Correct  TOURNIQUET:   Total Tourniquet Time Documented: Thigh (Right) - 48 minutes Total: Thigh (Right) - 48 minutes   DICTATION:  Indication for procedure:    The patient is a 63 y.o. female who has failed conservative treatment for osteoarthritis right knee.  Informed consent was obtained prior to anesthesia. The risks versus benefits of the operation were explain and in a way the patient can, and did, understand.   On the implant demand matching protocol, this patient scored 15.  Therefore, this patient was receive a polyethylene insert with vitamin E which is a high demand implant.  Description of procedure:     The patient was taken to the operating room and placed under anesthesia.  The patient was positioned in the usual fashion taking care that all body parts were adequately padded and/or protected.  I foley catheter was not placed.  A tourniquet was applied and the leg prepped and draped in the usual sterile fashion.  The extremity was exsanguinated with the esmarch and tourniquet inflated to 350 mmHg.  Pre-operative range of motion was normal.  The knee was in 5 degree of mild varus.  A midline incision approximately 6-7 inches long was made with a #10 blade.  A new blade was used to make a parapatellar arthrotomy going 2-3 cm into the quadriceps tendon, over the patella, and alongside the medial aspect of the patellar tendon.  A synovectomy was then performed with the #10 blade and forceps. I then elevated the deep MCL off the medial tibial metaphysis  subperiosteally around to the semimembranosus attachment.    I everted the patella and used calipers to measure patellar thickness.  I used the reamer to ream down to appropriate thickness to recreate the native thickness.  I then removed excess bone with the rongeur and sagittal saw.  I used the appropriately sized template and drilled the three lug holes.  I then put the trial in place and measured the thickness with the calipers to ensure recreation of the native thickness.  The trial was then removed and the patella subluxed and the knee brought into flexion.  A homan retractor was place to retract and protect the patella and lateral structures.  A Z-retractor was place medially to protect the medial structures.  The extra-medullary alignment system was used to make cut the tibial articular surface perpendicular to the anamotic axis of the tibia and in 3 degrees of posterior slope.  The cut surface and alignment jig was removed.  I then used the intramedullary alignment guide to make a 6 valgus cut on the distal femur.  I then marked out the epicondylar axis on the distal femur.  The posterior condylar axis measured 3 degrees.  I then used the anterior referencing sizer and measured the femur to be a size 7.  The 4-In-1 cutting block was screwed into place in external rotation matching the posterior condylar angle, making our cuts perpendicular to the epicondylar axis.  Anterior, posterior and chamfer cuts were made with the sagittal saw.  The cutting block and cut pieces were removed.  A lamina  spreader was placed in 90 degrees of flexion.  The ACL, PCL, menisci, and posterior condylar osteophytes were removed.  A 11 mm spacer blocked was found to offer good flexion and extension gap balance after mild in degree releasing.   The scoop retractor was then placed and the femoral finishing block was pinned in place.  The small sagittal saw was used as well as the lug drill to finish the femur.  The block  and cut surfaces were removed and the medullary canal hole filled with autograft bone from the cut pieces.  The tibia was delivered forward in deep flexion and external rotation.  A size F tray was selected and pinned into place centered on the medial 1/3 of the tibial tubercle.  The reamer and keel was used to prepare the tibia through the tray.    I then trialed with the size 7 femur, size F tibia, a 11 mm insert and the 32 patella.  I had excellent flexion/extension gap balance, excellent patella tracking.  Flexion was full and beyond 120 degrees; extension was zero.  These components were chosen and the staff opened them to me on the back table while the knee was lavaged copiously and the cement mixed.  The soft tissue was infiltrated with 60cc of exparel 1.3% through a 21 gauge needle.  I cemented in the components and removed all excess cement.  The polyethylene tibial component was snapped into place and the knee placed in extension while cement was hardening.  The capsule was infilltrated with 30cc of .25% Marcaine with epinephrine.  A hemovac was place in the joint exiting superolaterally.  A pain pump was place superomedially superficial to the arthrotomy.  Once the cement was hard, the tourniquet was let down.  Hemostasis was obtained.  The arthrotomy was closed with figure-8 #1 vicryl sutures.  The deep soft tissues were closed with #0 vicryls and the subcuticular layer closed with a running #2-0 vicryl.  The skin was reapproximated and closed with skin staples.  The wound was dressed with xeroform, 4 x4's, 2 ABD sponges, a single layer of webril and a TED stocking.   The patient was then awakened, extubated, and taken to the recovery room in stable condition.  BLOOD LOSS:  300cc DRAINS: 1 hemovac, 1 pain catheter COMPLICATIONS:  None.  PLAN OF CARE: Admit to inpatient   PATIENT DISPOSITION:  PACU - hemodynamically stable.   Delay start of Pharmacological VTE agent (>24hrs) due to  surgical blood loss or risk of bleeding:  not applicable  Please fax a copy of this op note to my office at [phone] (please only include page 1 and 2 of the Case Information op note)

## 2014-02-08 NOTE — Evaluation (Signed)
Physical Therapy Evaluation Patient Details Name: Heather Mueller Agnes MRN: [id number] DOB: 08-Feb-1951 Today's Date: 02/08/2014   History of Present Illness  Patient is a 63 y/o female d/p right TKA  Clinical Impression  Patient presents with decreased independence with mobility due to deficits listed in PT problem list.  She will benefit from skilled PT in the acute setting to allow return home with spouse asisst and HHPT.    Follow Up Recommendations Home health PT    Equipment Recommendations  None recommended by PT    Recommendations for Other Services       Precautions / Restrictions Precautions Precautions: Fall      Mobility  Bed Mobility Overal bed mobility: Needs Assistance Bed Mobility: Supine to Sit     Supine to sit: Min assist;HOB elevated     General bed mobility comments: assist for right LE  Transfers Overall transfer level: Needs assistance Equipment used: Rolling walker (2 wheeled) Transfers: Sit to/from UGI CorporationStand;Stand Pivot Transfers Sit to Stand: Mod assist Stand pivot transfers: Min assist;+2 safety/equipment       General transfer comment: from bed, lifting assist due to right LE numb, weak from block; used walker and minimal weight right LE due to knee buckling  Ambulation/Gait                Stairs            Wheelchair Mobility    Modified Rankin (Stroke Patients Only)       Balance Overall balance assessment: Needs assistance         Standing balance support: Bilateral upper extremity supported Standing balance-Leahy Scale: Poor Standing balance comment: assist for balance due to right LE weakness                             Pertinent Vitals/Pain Pain in right knee with weight bearing, otherwise no pain; HR 77, SpO2 97% on 1L O2, BP 123/67    Home Living Family/patient expects to be discharged to:: Private residence Living Arrangements: Spouse/significant other Available Help at Discharge:  Family;Available 24 hours/day Type of Home: House Home Access: Stairs to enter Entrance Stairs-Rails: Right Entrance Stairs-Number of Steps: 3 Home Layout: One level Home Equipment: Walker - 2 wheels;Walker - 4 wheels;Cane - single point;Bedside commode;Shower seat      Prior Function Level of Independence: Independent               Hand Dominance   Dominant Hand: Left    Extremity/Trunk Assessment               Lower Extremity Assessment: RLE deficits/detail RLE Deficits / Details: AAROM knee flexion seated 80 degrees, extension -5; strength knee extension 3-/5, hip flexion 3-/5 LLE Deficits / Details: WFL, states needs TKA on that side too     Communication   Communication: No difficulties  Cognition Arousal/Alertness: Lethargic;Suspect due to medications Behavior During Therapy: [facility]FL for tasks assessed/performed Overall Cognitive Status: Within Functional Limits for tasks assessed                      General Comments      Exercises Total Joint Exercises Ankle Circles/Pumps: AROM;10 reps;Supine;Right Quad Sets: AROM;5 reps;Supine;Right      Assessment/Plan    PT Assessment Patient needs continued PT services  PT Diagnosis Difficulty walking;Acute pain   PT Problem List Decreased strength;Decreased range of motion;Decreased mobility;Decreased activity tolerance;Decreased balance;Pain;Decreased knowledge of use  of DME  PT Treatment Interventions DME instruction;Gait training;Therapeutic exercise;Balance training;Stair training;Functional mobility training;Therapeutic activities;Patient/family education   PT Goals (Current goals can be found in the Care Plan section) Acute Rehab PT Goals Patient Stated Goal: To go home PT Goal Formulation: With patient/family Time For Goal Achievement: 02/15/14 Potential to Achieve Goals: Good    Frequency Min 6X/week   Barriers to discharge        Co-evaluation               End of Session  Equipment Utilized During Treatment: Gait belt;Oxygen Activity Tolerance: Patient limited by fatigue (nausea) Patient left: in chair;with call bell/phone within reach;with family/visitor present           Time: 1348-1410 PT Time Calculation (min): 22 min   Charges:   PT Evaluation $Initial PT Evaluation Tier I: 1 Procedure PT Treatments $Therapeutic Activity: 8-22 mins   PT G Codes:          Ane PaymentCynthia R Wynn 02/08/2014, 2:25 PM Sheran Lawlessyndi Wynn, PT [phone] 02/08/2014

## 2014-02-08 NOTE — H&P (Signed)
  Eliezer BottomDawn Marcon MRN:  [id number] DOB/SEX:  September 23, 1951/female  CHIEF COMPLAINT:  Painful right Knee  HISTORY: Patient is a 63 y.o. female presented with a history of pain in the right knee. Onset of symptoms was gradual starting several years ago with gradually worsening course since that time. Prior procedures on the knee include arthroscopy. Patient has been treated conservatively with over-the-counter NSAIDs and activity modification. Patient currently rates pain in the knee at 10 out of 10 with activity. There is pain at night.  PAST MEDICAL HISTORY: There are no active problems to display for this patient.  Past Medical History  Diagnosis Date  . PONV (postoperative nausea and vomiting)   . Hypertension   . Arthritis   . [id number](478.2Headache(784.0)    Past Surgical History  Procedure Laterality Date  . Abdominal hysterectomy      partial  . Colonoscopy w/ biopsies and polypectomy    . Breast surgery      Lumpectomy x 2 right breast  . Bladder suspension      Hx: x 2     MEDICATIONS:   No prescriptions prior to admission    ALLERGIES:   Allergies  Allergen Reactions  . Mango Flavor Rash  . Penicillins Rash    REVIEW OF SYSTEMS:  A comprehensive review of systems was negative.   FAMILY HISTORY:   Family History  Problem Relation Age of Onset  . Hypertension Mother   . Hypercholesterolemia Mother   . Hypertension Father   . Heart attack Father   . Cancer - Lung Father   . Hypertension Sister   . Hypertension Brother     SOCIAL HISTORY:   History  Substance Use Topics  . Smoking status: Never Smoker   . Smokeless tobacco: Never Used  . Alcohol Use: Yes     Comment: very rare     EXAMINATION:  Vital signs in last 24 hours:    General appearance: alert, cooperative and no distress Lungs: clear to auscultation bilaterally Heart: regular rate and rhythm, S1, S2 normal, no murmur, click, rub or gallop Abdomen: soft, non-tender; bowel sounds normal; no masses,  no  organomegaly Extremities: Homans sign is negative, no sign of DVT Pulses: 2+ and symmetric Skin: Skin color, texture, turgor normal. No rashes or lesions Neurologic: Alert and oriented X 3, normal strength and tone. Normal symmetric reflexes. Normal coordination and gait  Musculoskeletal:  ROM 0-110, Ligaments intact,  Imaging Review Plain radiographs demonstrate severe degenerative joint disease of the right knee. The overall alignment is mild valgus. The bone quality appears to be good for age and reported activity level.  Assessment/Plan: End stage arthritis, right knee   The patient history, physical examination and imaging studies are consistent with advanced degenerative joint disease of the right knee. The patient has failed conservative treatment.  The clearance notes were reviewed.  After discussion with the patient it was felt that Total Knee Replacement was indicated. The procedure,  risks, and benefits of total knee arthroplasty were presented and reviewed. The risks including but not limited to aseptic loosening, infection, blood clots, vascular injury, stiffness, patella tracking problems complications among others were discussed. The patient acknowledged the explanation, agreed to proceed with the plan.  Altamese CabalMaurice Dom Haverland 02/08/2014, 6:39 AM

## 2014-02-09 ENCOUNTER — Encounter (HOSPITAL_COMMUNITY): Payer: Self-pay | Admitting: *Deleted

## 2014-02-09 LAB — BASIC METABOLIC PANEL
BUN: 12 mg/dL (ref 6–23)
CHLORIDE: 102 meq/L (ref 96–112)
CO2: 27 mEq/L (ref 19–32)
Calcium: 9 mg/dL (ref 8.4–10.5)
Creatinine, Ser: 0.64 mg/dL (ref 0.50–1.10)
GFR calc Af Amer: >90 mL/min (ref >90)
GFR calc non Af Amer: >90 mL/min (ref >90)
Glucose, Bld: 108 mg/dL — ABNORMAL HIGH (ref 70–99)
POTASSIUM: 4.4 meq/L (ref 3.7–5.3)
Sodium: 142 mEq/L (ref 137–147)

## 2014-02-09 LAB — CBC
HCT: 34.4 % — ABNORMAL LOW (ref 36.0–46.0)
Hemoglobin: 11.7 g/dL — ABNORMAL LOW (ref 12.0–15.0)
MCH: 30.2 pg (ref 26.0–34.0)
MCHC: 34 g/dL (ref 30.0–36.0)
MCV: 88.9 fL (ref 78.0–100.0)
PLATELETS: 242 10*3/uL (ref 150–400)
RBC: 3.87 MIL/uL (ref 3.87–5.11)
RDW: 13.5 % (ref 11.5–15.5)
WBC: 12.3 10*3/uL — AB (ref 4.0–10.5)

## 2014-02-09 MED ORDER — ENOXAPARIN SODIUM 40 MG/0.4ML ~~LOC~~ SOLN: 40.0000 mg | SUBCUTANEOUS | Status: DC

## 2014-02-09 MED ORDER — CELECOXIB 200 MG PO CAPS: 200.0000 mg | ORAL_CAPSULE | Freq: Two times a day (BID) | ORAL | Status: DC

## 2014-02-09 MED ORDER — METOCLOPRAMIDE HCL 5 MG PO TABS
5.0000 mg | ORAL_TABLET | Freq: Three times a day (TID) | ORAL | Status: DC | PRN
Start: 2014-02-09 — End: 2017-02-12

## 2014-02-09 MED ORDER — METHOCARBAMOL 500 MG PO TABS
500.0000 mg | ORAL_TABLET | Freq: Four times a day (QID) | ORAL | Status: DC | PRN
Start: 2014-02-09 — End: 2017-02-12

## 2014-02-09 MED ORDER — OXYCODONE HCL 5 MG PO TABS: 5.0000 mg | ORAL_TABLET | ORAL | Status: DC | PRN

## 2014-02-09 MED ORDER — OXYCODONE HCL ER 10 MG PO T12A: 10.0000 mg | EXTENDED_RELEASE_TABLET | Freq: Two times a day (BID) | ORAL | Status: DC

## 2014-02-09 NOTE — Evaluation (Signed)
Occupational Therapy Evaluation Patient Details Name: Heather BottomDawn Berghuis MRN: [id number] DOB: [birth year] Today's Date: 02/09/2014    History of Present Illness Patient is a 63 y/o female d/p right TKA   Clinical Impression   Pt presents with below problem list. Pt independent with ADLs, PTA. Feel pt will benefit from acute OT to increase independence prior to d/c.     Follow Up Recommendations  No OT follow up;Supervision - Intermittent    Equipment Recommendations  None recommended by OT    Recommendations for Other Services       Precautions / Restrictions Precautions Precautions: Fall Restrictions Weight Bearing Restrictions: Yes RLE Weight Bearing: Weight bearing as tolerated      Mobility  Transfers Overall transfer level: Needs assistance Equipment used: Rolling walker (2 wheeled) Transfers: Sit to/from Stand Sit to Stand: Min guard;Min assist         General transfer comment: Assisted with positioning for RLE with toilet transfer.    Balance                                            ADL Overall ADL's : Needs assistance/impaired                 Upper Body Dressing : Set up;Sitting   Lower Body Dressing: Minimal assistance;Sit to/from stand   Toilet Transfer: Minimal assistance;Ambulation;RW (3 in 1 over commode)   Toileting- Clothing Manipulation and Hygiene: Min guard;Sit to/from stand       Functional mobility during ADLs: Minimal assistance;Rolling walker General ADL Comments: educated on shower and tub/shower transfers. Pt planning to sponge bathe for a few days and did not want to practice shower transfer, but seemed appreciative of education. Educated on benefit of reaching down to don/doff right sock as it increases ROM in knee. Discussed safety tips (safe shoe wear, sitting for bathing/dressing).  Pt interested in seeing AE for LB ADLs-educated on this.     Vision                     Perception     Praxis       Pertinent Vitals/Pain Pain 6/10. Repositioned. Increased activity during session.       Hand Dominance Left   Extremity/Trunk Assessment Upper Extremity Assessment Upper Extremity Assessment: Overall WFL for tasks assessed   Lower Extremity Assessment Lower Extremity Assessment: Defer to PT evaluation       Communication Communication Communication: No difficulties   Cognition Arousal/Alertness: Awake/alert Behavior During Therapy: WFL for tasks assessed/performed Overall Cognitive Status: Within Functional Limits for tasks assessed                     General Comments       Exercises       Shoulder Instructions      Home Living Family/patient expects to be discharged to:: Private residence Living Arrangements: Spouse/significant other Available Help at Discharge: Family;Available 24 hours/day Type of Home: House Home Access: Stairs to enter Enter[employer] CorporationEntrance Stairs-Number of Steps: 3 Entrance Stairs-Rails: Right Home Layout: One level     Bathroom Shower/Tub: Tub/shower unit;Walk-in shower Shower/tub characteristics: Door (on walk in shower) Bathroom Toilet: Standard Bathroom Accessibility: Yes How Accessible: Accessible via walker Home Equipment: Walker - 2 wheels;Walker - 4 wheels;Cane - single point;Bedside commode;Shower seat - built in  Prior Functioning/Environment Level of Independence: Independent             OT Diagnosis: Acute pain   OT Problem List: Decreased strength;Pain;Decreased knowledge of precautions;Decreased knowledge of use of DME or AE;Decreased range of motion;Decreased activity tolerance   OT Treatment/Interventions: Self-care/ADL training;DME and/or AE instruction;Therapeutic activities;Patient/family education;Balance training    OT Goals(Current goals can be found in the care plan section) Acute Rehab OT Goals Patient Stated Goal: not stated OT Goal Formulation: With patient Time For Goal Achievement:  02/16/14 Potential to Achieve Goals: Good ADL Goals Pt Will Perform Lower Body Dressing: with modified independence;sit to/from stand Pt Will Transfer to Toilet: with modified independence;ambulating (3 in 1 over commode) Pt Will Perform Toileting - Clothing Manipulation and hygiene: with modified independence;sit to/from stand  OT Frequency: Min 2X/week   Barriers to D/C:            Co-evaluation              End of Session Equipment Utilized During Treatment: Gait belt;Rolling walker CPM Right Knee CPM Right Knee: Off  Activity Tolerance: Patient tolerated treatment well (became nauseous towards end) Patient left: in chair;with call bell/phone within reach;with family/visitor present   Time: [id number] OT Time Calculation (min): 33 min Charges:  OT General Charges $OT Visit: 1 Procedure OT Evaluation $Initial OT Evaluation Tier I: 1 Procedure OT Treatments $Self Care/Home Management : 8-22 mins G-Codes:    Earlie RavelingLindsey L Myquan Schaumburg OTR/L [phone] 02/09/2014, 4:34 PM

## 2014-02-09 NOTE — Progress Notes (Signed)
OT Cancellation Note  Patient Details Name: Heather Mueller MRN: [id number] DOB: 1950/11/03   Cancelled Treatment:    Reason Eval/Treat Not Completed: Pain limiting ability to participate;Fatigue/lethargy limiting ability to participate. Pt c/o weakness, lethargy, nausea since her last pain medication and difficulty keeping eyes open. Pt in chair after PT session and declined OT at this time. Pt plans d/c home and would benefit from OT assessment to determine needs for home. OT to follow-up with pt. Notified RN of pt's complaints.   Heather Mueller [phone] 02/09/2014, 9:13 AM

## 2014-02-09 NOTE — Progress Notes (Signed)
Physical Therapy Treatment Patient Details Name: Heather Mueller MRN: [id number] DOB: 12/30/1950 Today's Date: 02/09/2014    History of Present Illness Patient is a 63 y/o female d/p right TKA    PT Comments    Pt continues to be limited by R LE pain and c/o dizziness with increased mobility. Pt/husband state they are hesitant to attempt stair negotiation today and are worried about being d/c'd home today.  Both pt and husband state they would like to stay 1 more night in the hospital due to pain issues.  PT recommends 24 physical assistance with mobility at this time.  Follow Up Recommendations  Home health PT;Supervision for mobility/OOB     Equipment Recommendations  None recommended by PT    Recommendations for Other Services       Precautions / Restrictions Precautions Precautions: Fall Restrictions Weight Bearing Restrictions: No    Mobility  Bed Mobility Overal bed mobility: Modified Independent                Transfers   Equipment used: Rolling walker (2 wheeled)   Sit to Stand: Supervision         General transfer comment: supervision from bed and 3 in 1  Ambulation/Gait Ambulation/Gait assistance: Supervision Ambulation Distance (Feet): 20 Feet         General Gait Details: continues to be limited by R LE pain, step to gait pattern, able to progress to step through pattern with repetition and cuing   Stairs            Wheelchair Mobility    Modified Rankin (Stroke Patients Only)       Balance                                    Cognition Arousal/Alertness: Awake/alert Behavior During Therapy: WFL for tasks assessed/performed Overall Cognitive Status: Within Functional Limits for tasks assessed                      Exercises Total Joint Exercises Ankle Circles/Pumps: AROM;10 reps;Both Quad Sets: AROM;Right;10 reps Towel Squeeze: AROM;Both;10 reps Short Arc QuadBarbaraann Boys: AAROM;Right;10 reps Heel Slides:  AAROM;10 reps;Right Hip ABduction/ADduction: AAROM;10 reps;Right    General Comments        Pertinent Vitals/Pain Pt c/o 8/10 R knee pain with gait, RN aware, repositioned and rest as appropriate    Home Living                      Prior Function            PT Goals (current goals can now be found in the care plan section) Progress towards PT goals: Progressing toward goals    Frequency  Min 6X/week    PT Plan Current plan remains appropriate    Co-evaluation             End of Session Equipment Utilized During Treatment: Gait belt Activity Tolerance: Patient tolerated treatment well Patient left: in chair;with family/visitor present;with call bell/phone within reach     Time: [id number] PT Time Calculation (min): 29 min  Charges:  $Gait Training: 8-22 mins $Therapeutic Exercise: 8-22 mins                    G Codes:      Leone BrandKaren K Donawerth 02/09/2014, 1:35 PM

## 2014-02-09 NOTE — Progress Notes (Signed)
Orthopedic Tech Progress Note Patient Details:  Heather Mueller 02-15-1951 [id number] Patient currently sitting in chair. Patient states she was placed in chair around 1330 and wishes to remain sitting up for a while longer. Will check for CPM application later. CPM Right Knee CPM Right Knee: Off Right Knee Flexion (Degrees): 90 Right Knee Extension (Degrees): 0   [name] 02/09/2014, 2:36 PM

## 2014-02-09 NOTE — Progress Notes (Signed)
Physical Therapy Treatment Patient Details Name: Eliezer BottomDawn Folker MRN: [id number] DOB: 1951/07/05 Today's Date: 02/09/2014    History of Present Illness Patient is a 63 y/o female d/p right TKA    PT Comments    Pt limited by nausea this session, required increased time and frequent rest breaks.  Pt continues to require min A with gait and mobility, will benefit from continued PT services.  Follow Up Recommendations  Home health PT;Supervision for mobility/OOB     Equipment Recommendations  None recommended by PT    Recommendations for Other Services       Precautions / Restrictions Precautions Precautions: Fall Restrictions Weight Bearing Restrictions: No    Mobility  Bed Mobility Overal bed mobility: Modified Independent                Transfers Overall transfer level: Needs assistance Equipment used: Rolling walker (2 wheeled)   Sit to Stand: Min assist         General transfer comment: lifting assist, cues for UE and LE placement  Ambulation/Gait Ambulation/Gait assistance: Min assist Ambulation Distance (Feet): 15 Feet Assistive device: Rolling walker (2 wheeled)       General Gait Details: pt with step to pattern, very little weight on R LE.  cues for step through pattern and increasing wt bearing on R LE, pt able to perform for 1-2 steps, then returns to hopping gait pattern   Stairs         General stair comments: pt became nauseous during gait training, unable to attempt stairs this session.  PT demo'd stair negotiation to pt using 1 railing to simulate pt's home entry.  Pt agreeable to attempt stairs at later PT session  Wheelchair Mobility    Modified Rankin (Stroke Patients Only)       Balance                                    Cognition Arousal/Alertness: Awake/alert Behavior During Therapy: WFL for tasks assessed/performed Overall Cognitive Status: Within Functional Limits for tasks assessed                       Exercises Total Joint Exercises Ankle Circles/Pumps: AROM;10 reps;Both Quad Sets: AROM;Right;10 reps    General Comments        Pertinent Vitals/Pain Pt rec'd pain medication 1 hour prior to PT session, no c/o pain during session    Home Living                      Prior Function            PT Goals (current goals can now be found in the care plan section) Progress towards PT goals: Progressing toward goals    Frequency  Min 6X/week    PT Plan Current plan remains appropriate    Co-evaluation             End of Session Equipment Utilized During Treatment: Gait belt Activity Tolerance: Patient limited by fatigue (nausea) Patient left: in chair;with call bell/phone within reach;with nursing/sitter in room     Time: 0730-0757 PT Time Calculation (min): 27 min  Charges:  $Gait Training: 8-22 mins $Therapeutic Activity: 8-22 mins                    G Codes:      Leone BrandKaren K Donawerth 02/09/2014, 8:01  AM

## 2014-02-09 NOTE — Discharge Instructions (Signed)
Diet: As you were doing prior to hospitalization  ° °Activity:  Increase activity slowly as tolerated  °                No lifting or driving for 6 weeks ° °Shower:  May shower without a dressing once there is no drainage from your wound. °                Do NOT wash over the wound. °                °Dressing:  You may change your dressing on Wednesday °                   Then change the dressing daily with sterile 4"x4"s gauze dressing  °                   And TED hose for knees. ° °Weight Bearing:  Weight bearing as tolerated as taught in physical therapy.  Use a                                walker or Crutches as instructed. ° °To prevent constipation: you may use a stool softener such as - °              Colace ( over the counter) 100 mg by mouth twice a day  °              Drink plenty of fluids ( prune juice may be helpful) and high fiber foods °               Miralax ( over the counter) for constipation as needed.   ° °Precautions:  If you experience chest pain or shortness of breath - call 911 immediately               For transfer to the hospital emergency department!! °              If you develop a fever greater that 101 F, purulent drainage from wound,                             increased redness or drainage from wound, or calf pain -- Call the office. ° °Follow- Up Appointment:  Please call for an appointment to be seen on 02/23/14 °                                             [place] office:  [phone] °           [address] [place], [place] [address] °               ° ° °

## 2014-02-09 NOTE — Progress Notes (Signed)
SPORTS MEDICINE AND JOINT REPLACEMENT  Heather SpurlingStephen Lucey, MD   Altamese CabalMaurice Taelar Gronewold, PA-C [address], [place], [place]  [id number]                             [phone]   PROGRESS NOTE  Subjective:  negative for Chest Pain  negative for Shortness of Breath  positive for Nausea/Vomiting   negative for Calf Pain  negative for Bowel Movement   Tolerating Diet: yes         Patient reports pain as 5 on 0-10 scale.    Objective: Vital signs in last 24 hours:   Patient Vitals for the past 24 hrs:  BP Temp Temp src Pulse Resp SpO2  02/09/14 1242 153/72 mmHg 98.3 F (36.8 C) - 94 18 95 %  02/09/14 1130 137/63 mmHg 98.3 F (36.8 C) Oral 88 18 95 %  02/09/14 0819 139/69 mmHg - - 92 18 -  02/09/14 0800 - - - - 18 99 %  02/09/14 0745 134/66 mmHg 98 F (36.7 C) Oral 86 18 98 %  02/09/14 0612 116/60 mmHg 98.1 F (36.7 C) Oral 90 18 97 %  02/09/14 0204 116/57 mmHg 98.4 F (36.9 C) Oral 90 18 95 %  02/08/14 2008 129/65 mmHg 98 F (36.7 C) Oral 93 18 94 %  02/08/14 1600 151/81 mmHg 97.7 F (36.5 C) Oral 87 16 95 %  02/08/14 1400 133/77 mmHg 97.1 F (36.2 C) Oral 88 16 99 %    @flow {1959:LAST@   Intake/Output from previous day:   04/20 0701 - 04/21 0700 In: 1990 [P.O.:240; I.V.:1750] Out: 260 [Drains:220]   Intake/Output this shift:   04/21 0701 - 04/21 1900 In: 240 [P.O.:240] Out: -    Intake/Output     04/20 0701 - 04/21 0700 04/21 0701 - 04/22 0700   P.O. 240 240   I.V. (mL/kg) 1750 (16.9)    Total Intake(mL/kg) 1990 (19.2) 240 (2.3)   Drains 220    Blood 40    Total Output 260     Net +1730 +240        Urine Occurrence 2 x       LABORATORY DATA:  Recent Labs  02/08/14 1241 02/09/14 0540  WBC 13.3* 12.3*  HGB 13.8 11.7*  HCT 39.8 34.4*  PLT 259 242    Recent Labs  02/08/14 1241 02/09/14 0540  NA  --  142  K  --  4.4  CL  --  102  CO2  --  27  BUN  --  12  CREATININE 0.50 0.64  GLUCOSE  --  108*  CALCIUM  --  9.0   Lab Results  Component  Value Date   INR 0.99 01/29/2014    Examination:  General appearance: alert, cooperative and no distress Extremities: Homans sign is negative, no sign of DVT  Wound Exam: clean, dry, intact   Drainage:  Scant/small amount Serosanguinous exudate  Motor Exam: EHL and FHL Intact  Sensory Exam: Deep Peroneal normal   Assessment:    1 Day Post-Op  Procedure(s) (LRB): RIGHT TOTAL KNEE ARTHROPLASTY (Right)  ADDITIONAL DIAGNOSIS:  Active Problems:   S/P total knee arthroplasty  Acute Blood Loss Anemia   Plan: Physical Therapy as ordered Weight Bearing as Tolerated (WBAT)  DVT Prophylaxis:  Lovenox  DISCHARGE PLAN: Home  DISCHARGE NEEDS: HHPT, CPM, Walker and 3-in-1 comode seat         Everlean AlstromMaurice  Yetta BarreJones 02/09/2014, 1:35 PM

## 2014-02-10 LAB — CBC
HEMATOCRIT: 31.8 % — AB (ref 36.0–46.0)
Hemoglobin: 10.4 g/dL — ABNORMAL LOW (ref 12.0–15.0)
MCH: 29.7 pg (ref 26.0–34.0)
MCHC: 32.7 g/dL (ref 30.0–36.0)
MCV: 90.9 fL (ref 78.0–100.0)
PLATELETS: 240 10*3/uL (ref 150–400)
RBC: 3.5 MIL/uL — AB (ref 3.87–5.11)
RDW: 13.9 % (ref 11.5–15.5)
WBC: 10.2 10*3/uL (ref 4.0–10.5)

## 2014-02-10 NOTE — Progress Notes (Addendum)
Oxycodone,Oxycontin with other medications to be given by SN / Instructor. lovenox education done- declines written information. Injection demonstration to be done by SN / Instructor.  1100- Patient decided to receive Lovenox written information - given. D/C in stable condition via w/c - scripts given 02/09/14.

## 2014-02-10 NOTE — Care Management Note (Signed)
CARE MANAGEMENT NOTE 02/10/2014  Patient:  Heather Mueller,Heather Mueller   Account Number:  [id number]  Date Initiated:  02/10/2014  Documentation initiated by:  Vance PeperBRADY,Jalani Rominger  Subjective/Objective Assessment:   63 yr old female s/p right total knee arthroplasty.     Action/Plan:   Case manager spoke with patient concerning home health needs and DME. Patient [place] not be covered by [employer] [place] [place]. Choice offered. [facility]. Patient has DME.   Anticipated DC Date:  02/10/2014   Anticipated DC Plan:  HOME W HOME HEALTH SERVICES      DC Planning Services  CM consult      [facility]  HOME HEALTH  DURABLE MEDICAL EQUIPMENT   Choice offered to / List presented to:  C-1 Patient      DME agency  [employer]     HH arranged  HH-2 PT      HH agency  [facility]   Status of service:  Completed, signed off Medicare Important Message given?   (If response is "NO", the following Medicare IM given date fields will be blank) Date Medicare IM given:   Date Additional Medicare IM given:    Discharge Disposition:  HOME W HOME HEALTH SERVICES  Per UR Regulation:    If discussed at Long Length of Stay Meetings, dates discussed:    Comments:  02/10/14 10:27 am Vance PeperSusan Yacqub Baston, RN BSN Case Manager [phone] Patient's husband [facility] that [facility] contact them via his cell # [phone]. CM called Hilda LiasMarie, [facility] with this information.

## 2014-02-10 NOTE — Progress Notes (Signed)
Physical Therapy Treatment Patient Details Name: Heather Mueller MRN: [id number] DOB: 21-Aug-1951 Today's Date: 02/10/2014    History of Present Illness Patient is a 63 y/o female d/p right TKA    PT Comments    Pt much improved with gait and mobility this session.  Able to gait with supervision 100' x 2, able to perform stairs and curb step at supervision level.  Pt feels comfortable to d/c home at this level.  PT continues to recommend HHPT for ROM and strengthening  Follow Up Recommendations  Home health PT;Supervision for mobility/OOB     Equipment Recommendations  None recommended by PT    Recommendations for Other Services       Precautions / Restrictions Precautions Precautions: Fall Restrictions RLE Weight Bearing: Weight bearing as tolerated    Mobility  Bed Mobility Overal bed mobility: Modified Independent                Transfers Overall transfer level: Modified independent                  Ambulation/Gait Ambulation/Gait assistance: Supervision Ambulation Distance (Feet): 100 Feet (x 2) Assistive device: Rolling walker (2 wheeled)       General Gait Details: pt improves with longer distance gait, able to progress to step through pattern with cuing   Stairs Stairs: Yes Stairs assistance: Supervision Stair Management: One rail Right Number of Stairs: 4 General stair comments: stair negotiation with 1 R handrail to simulate home entry, pt able to perform with supervision, cuing for sequencing.   Also practiced curb step training with RW to simulate 1 step from porch into pt's house.  Pt performed with RW with supervision, cues for technique  Wheelchair Mobility    Modified Rankin (Stroke Patients Only)       Balance                                    Cognition Arousal/Alertness: Awake/alert Behavior During Therapy: WFL for tasks assessed/performed Overall Cognitive Status: Within Functional Limits for tasks  assessed                      Exercises      General Comments        Pertinent Vitals/Pain No c/o pain    Home Living                      Prior Function            PT Goals (current goals can now be found in the care plan section) Progress towards PT goals: Progressing toward goals    Frequency  Min 6X/week    PT Plan Current plan remains appropriate    Co-evaluation             End of Session Equipment Utilized During Treatment: Gait belt Activity Tolerance: Patient tolerated treatment well Patient left: in chair;with call bell/phone within reach     Time: 0730-0758 PT Time Calculation (min): 28 min  Charges:  $Gait Training: 23-37 mins                    G Codes:      Leone BrandKaren K Nazirah Tri 02/10/2014, 7:59 AM

## 2014-02-12 ENCOUNTER — Emergency Department (HOSPITAL_BASED_OUTPATIENT_CLINIC_OR_DEPARTMENT_OTHER)
Admission: EM | Admit: 2014-02-12 | Discharge: 2014-02-12 | Disposition: A | Discharge status: Home / Self Care | Payer: Managed Care, Other (non HMO) | Attending: Emergency Medicine | Admitting: Emergency Medicine

## 2014-02-12 ENCOUNTER — Encounter (HOSPITAL_BASED_OUTPATIENT_CLINIC_OR_DEPARTMENT_OTHER): Payer: Self-pay | Admitting: Emergency Medicine

## 2014-02-12 ENCOUNTER — Emergency Department (HOSPITAL_BASED_OUTPATIENT_CLINIC_OR_DEPARTMENT_OTHER): Payer: Managed Care, Other (non HMO)

## 2014-02-12 DIAGNOSIS — Z8739 Personal history of other diseases of the musculoskeletal system and connective tissue: Secondary | ICD-10-CM | POA: Insufficient documentation

## 2014-02-12 DIAGNOSIS — Z880 Allergy status to penicillin: Secondary | ICD-10-CM | POA: Insufficient documentation

## 2014-02-12 DIAGNOSIS — Z7901 Long term (current) use of anticoagulants: Secondary | ICD-10-CM | POA: Insufficient documentation

## 2014-02-12 DIAGNOSIS — I10 Essential (primary) hypertension: Secondary | ICD-10-CM | POA: Insufficient documentation

## 2014-02-12 DIAGNOSIS — G8918 Other acute postprocedural pain: Secondary | ICD-10-CM

## 2014-02-12 DIAGNOSIS — Z79899 Other long term (current) drug therapy: Secondary | ICD-10-CM | POA: Insufficient documentation

## 2014-02-12 DIAGNOSIS — M79609 Pain in unspecified limb: Secondary | ICD-10-CM | POA: Insufficient documentation

## 2014-02-12 NOTE — Discharge Instructions (Signed)
Pain Relief Preoperatively and Postoperatively °Being a good patient does not mean being a silent one. If you have questions, problems, or concerns about the pain you may feel after surgery, let your caregiver know. Patients have the right to assessment and management of pain. The treatment of pain after surgery is important to speed up recovery and return to normal activities. Severe pain after surgery, and the fear or anxiety associated with that pain, may cause extreme discomfort that: °· Prevents sleep. °· Decreases the ability to breathe deeply and cough. This can cause pneumonia or other upper airway infections. °· Causes your heart to beat faster and your blood pressure to be higher. °· Increases the risk for constipation and bloating. °· Decreases the ability of wounds to heal. °· May result in depression, increased anxiety, and feelings of helplessness. °Relief of pain before surgery is also important because it will lessen the pain after surgery. Patients who receive both pain relief before and after surgery experience greater pain relief than those who only receive pain relief after surgery. Let your caregiver know if you are having uncontrolled pain. This is very important. Pain after surgery is more difficult to manage if it is permitted to become severe, so prompt and adequate treatment of acute pain is necessary. °PAIN CONTROL METHODS °Your caregivers follow policies and procedures about the management of patient pain. These guidelines should be explained to you before surgery. Plans for pain control after surgery must be mutually decided upon and instituted with your full understanding and agreement. Do not be afraid to ask questions regarding the care you are receiving. There are many different ways your caregivers will attempt to control your pain, including the following methods. °As needed pain control °· You may be given pain medicine either through your intravenous (IV) tube, or as a pill or  liquid you can swallow. You will need to let your caregiver know when you are having pain. Then, your caregiver will give you the pain medicine ordered for you. °· Your pain medicine may make you constipated. If constipation occurs, drink more liquids if you can. Your caregiver may have you take a mild laxative. °IV patient-controlled analgesia pump (PCA pump) °· You can get your pain medicine through the IV tube which goes into your vein. You are able to control the amount of pain medicine that you get. The pain medicine flows in through an IV tube and is controlled by a pump. This pump gives you a set amount of pain medicine when you push the button hooked up to it. Nobody should push this button but you or someone specifically assigned by you to do so. It is set up to keep you from accidentally giving yourself too much pain medicine. You will be able to start using your pain pump in the recovery room after your surgery. This method can be helpful for most types of surgery. °· If you are still having too much pain, tell your caregiver. Also, tell your caregiver if you are feeling too sleepy or nauseous. °Continuous epidural pain control °· A thin, soft tube (catheter) is put into your back. Pain medicine flows through the catheter to lessen pain in the part of your body where the surgery is done. Continuous epidural pain control may work best for you if you are having surgery on your chest, abdomen, hip area, or legs. The epidural catheter is usually put into your back just before surgery. The catheter is left in until you can eat and take medicine by mouth. In most cases,   this may take 2 to 3 days. °· Giving pain medicine through the epidural catheter may help you heal faster because: °· Your bowel gets back to normal faster. °· You can get back to eating sooner. °· You can be up and walking sooner. °Medicine that numbs the area (local anesthetic) °· You may receive an injection of pain medicine near where the  pain is (local infiltration). °· You may receive an injection of pain medicine near the nerve that controls the sensation to a specific part of the body (peripheral nerve block). °· Medicine may be put in the spine to block pain (spinal block). °Opioids °· Moderate to moderately severe acute pain after surgery may respond to opioids. Opioids are narcotic pain medicine. Opioids are often combined with non-narcotic medicines to improve pain relief, diminish the risk of side effects, and reduce the chance of addiction. °· If you follow your caregiver's directions about taking opioids and you do not have a history of substance abuse, your risk of becoming addicted is exceptionally small. Opioids are given for short periods of time in careful doses to prevent addiction. °Other methods of pain control include: °· Steroids. °· Physical therapy. °· Heat and cold therapy. °· Compression, such as wrapping an elastic bandage around the area of pain. °· Massage. °These various ways of controlling pain may be used together. Combining different methods of pain control is called multimodal analgesia. Using this approach has many benefits, including being able to eat, move around, and leave the hospital sooner. °Document Released: 12/29/2002 Document Revised: 12/31/2011 Document Reviewed: 01/02/2011 °[employer]® Patient Information ©2014 [employer]. ° °

## 2014-02-12 NOTE — ED Notes (Signed)
Pt consulted her surgeon, and was told to visit ED if patient is concern for DVT. Right upper and lower leg pain since surgery, worsen today. Incision site appears slightly red but no signs of infection, and staples are intact. Right leg is swollen, but no redness, and not warm to touch. Pt alert, oriented.

## 2014-02-12 NOTE — ED Notes (Signed)
She had right knee replacement on Monday. She has had pain in her right lower leg for several days but worse today.

## 2014-02-12 NOTE — ED Provider Notes (Signed)
CSN: [id number]     Arrival date & time 02/12/14  1539 History   First MD Initiated Contact with Patient 02/12/14 1607     Chief Complaint  Patient presents with  . Leg Pain     (Consider location/radiation/quality/duration/timing/severity/associated sxs/prior Treatment) Patient is a 63 y.o. female presenting with leg pain.  Leg Pain  Pt reports she had total knee replacement earlier this week. Was doing well post-op, discharged home 2 days ago. PT was at the house today and she was complaining of moderate aching pain in posterior calf, radiating up behind the R knee and R thigh. No increased in swelling. She has been taking Lovenox daily for DVT prophylaxis. Advised by PT to come to the ED for eval of possible DVT. No CP, SOB.  Past Medical History  Diagnosis Date  . PONV (postoperative nausea and vomiting)   . Hypertension   . Arthritis   . MWUXLKGM(010.2Headache(784.0)    Past Surgical History  Procedure Laterality Date  . Abdominal hysterectomy      partial  . Colonoscopy w/ biopsies and polypectomy    . Breast surgery      Lumpectomy x 2 right breast  . Bladder suspension      Hx: x 2  . Total knee arthroplasty Right 02/08/2014    Procedure: RIGHT TOTAL KNEE ARTHROPLASTY;  Surgeon: Dannielle HuhSteve Lucey, MD;  Location: [facility] OR;  Service: Orthopedics;  Laterality: Right;   Family History  Problem Relation Age of Onset  . Hypertension Mother   . Hypercholesterolemia Mother   . Hypertension Father   . Heart attack Father   . Cancer - Lung Father   . Hypertension Sister   . Hypertension Brother    History  Substance Use Topics  . Smoking status: Never Smoker   . Smokeless tobacco: Never Used  . Alcohol Use: Yes     Comment: very rare   OB History   Grav Para Term Preterm Abortions TAB SAB Ect Mult Living                 Review of Systems All other systems reviewed and are negative except as noted in HPI.     Allergies  Bee venom; Mango flavor; and Penicillins  Home Medications    Prior to Admission medications   Medication Sig Start Date End Date Taking? Authorizing Provider  amLODipine (NORVASC) 5 MG tablet Take 5 mg by mouth daily.    Historical Provider, MD  Ascorbic Acid (VITAMIN C PO) Take 3 tablets by mouth daily. Each tablet is 17 mg    Historical Provider, MD  celecoxib (CELEBREX) 200 MG capsule Take 1 capsule (200 mg total) by mouth every 12 (twelve) hours. 02/09/14   Altamese CabalMaurice Jones, PA-C  enoxaparin (LOVENOX) 40 MG/0.4ML injection Inject 0.4 mLs (40 mg total) into the skin daily. 02/09/14   Altamese CabalMaurice Jones, PA-C  Glucosamine 500 MG CAPS Take 1,000 mg by mouth daily.    Historical Provider, MD  losartan (COZAAR) 50 MG tablet Take 50 mg by mouth 2 (two) times daily.    Historical Provider, MD  magnesium oxide (MAG-OX) 400 MG tablet Take 800 mg by mouth daily.    Historical Provider, MD  methocarbamol (ROBAXIN) 500 MG tablet Take 1-2 tablets (500-1,000 mg total) by mouth every 6 (six) hours as needed for muscle spasms. 02/09/14   Altamese CabalMaurice Jones, PA-C  metoCLOPramide (REGLAN) 5 MG tablet Take 1-2 tablets (5-10 mg total) by mouth every 8 (eight) hours as needed for nausea (  if ondansetron (ZOFRAN) ineffective.). 02/09/14   Altamese CabalMaurice Jones, PA-C  Multiple Vitamin (MULTIVITAMIN WITH MINERALS) TABS tablet Take 3 tablets by mouth daily.    Historical Provider, MD  NIACINAMIDE PO Take 1 tablet by mouth daily. Each tablet is 10 mg    Historical Provider, MD  oxyCODONE (OXY IR/ROXICODONE) 5 MG immediate release tablet Take 1-2 tablets (5-10 mg total) by mouth every 4 (four) hours as needed for breakthrough pain. 02/09/14   Altamese CabalMaurice Jones, PA-C  OxyCODONE (OXYCONTIN) 10 mg T12A 12 hr tablet Take 1 tablet (10 mg total) by mouth every 12 (twelve) hours. 02/09/14   Altamese CabalMaurice Jones, PA-C   BP 148/78  Pulse 86  Temp(Src) 98.3 F (36.8 C) (Oral)  Resp 20  SpO2 99% Physical Exam  Nursing note and vitals reviewed. Constitutional: She is oriented to person, place, and time. She appears  well-developed and well-nourished.  HENT:  Head: Normocephalic and atraumatic.  Eyes: EOM are normal. Pupils are equal, round, and reactive to light.  Neck: Normal range of motion. Neck supple.  Cardiovascular: Normal rate, normal heart sounds and intact distal pulses.   Pulmonary/Chest: Effort normal and breath sounds normal.  Abdominal: Bowel sounds are normal. She exhibits no distension. There is no tenderness.  Musculoskeletal: She exhibits tenderness (posterior calf, popliteal fossa, posterior thigh mildy tender to palpation). She exhibits no edema.  Healing surgical scar without signs of infection  Neurological: She is alert and oriented to person, place, and time. She has normal strength. No cranial nerve deficit or sensory deficit.  Skin: Skin is warm and dry. No rash noted.  Psychiatric: She has a normal mood and affect.    ED Course  Procedures (including critical care time) Labs Review Labs Reviewed - No data to display  Imaging Review [place] Venous Img Lower Unilateral Right  02/12/2014   CLINICAL DATA:  Recent right knee arthroplasty. Persistent right leg pain and swelling.  EXAM: RIGHT LOWER EXTREMITY VENOUS DOPPLER ULTRASOUND  TECHNIQUE: Gray-scale sonography with graded compression, as well as color Doppler and duplex ultrasound were performed to evaluate the lower extremity deep venous systems from the level of the common femoral vein and including the common femoral, femoral, profunda femoral, popliteal and calf veins including the posterior tibial, peroneal and gastrocnemius veins when visible. The superficial great saphenous vein was also interrogated. Spectral Doppler was utilized to evaluate flow at rest and with distal augmentation maneuvers in the common femoral, femoral and popliteal veins.  COMPARISON:  None.  FINDINGS: Common Femoral Vein: No evidence of thrombus. Normal compressibility, respiratory phasicity and response to augmentation. Increased pulsatility of the  venous waveform.  Saphenofemoral Junction: No evidence of thrombus. Normal compressibility and flow on color Doppler imaging.  Profunda Femoral Vein: No evidence of thrombus. Normal compressibility and flow on color Doppler imaging.  Femoral Vein: No evidence of thrombus. Normal compressibility, respiratory phasicity and response to augmentation. Increased pulsatility of the venous waveform.  Popliteal Vein: No evidence of thrombus. Normal compressibility, respiratory phasicity and response to augmentation.  Calf Veins: No evidence of thrombus. Normal compressibility and flow on color Doppler imaging.  Superficial Great Saphenous Vein: No evidence of thrombus. Normal compressibility and flow on color Doppler imaging.  Venous Reflux:  None.  Other Findings:  None.  IMPRESSION: 1. No evidence for deep venous thrombosis. 2. Increased pulsatility of the venous waveform is nonspecific but suggests elevated right heart pressures. Common differential considerations include tricuspid regurgitation, right heart strain, pulmonary arterial hypertension and COPD.   Electronically  Signed   By: Malachy Moan M.D.   On: 02/12/2014 17:05     EKG Interpretation None      MDM   Final diagnoses:  Post-operative pain   [place] neg for DVT. Continue Lovenox and home PT. Ortho followup as scheduled next week.    Nakyla Bracco B. Bernette Mayers, MD 02/12/14 1757

## 2014-02-17 NOTE — Discharge Summary (Signed)
SPORTS MEDICINE & JOINT REPLACEMENT   Georgena SpurlingStephen Lucey, MD   Altamese CabalMaurice Lora Chavers, PA-C [address], [place], [place]  [id number]                             [phone]  PATIENT ID: Heather BottomDawn Mueller        MRN:  [id number]          DOB/AGE: 30-Jun-1951 / 63 y.o.    DISCHARGE SUMMARY  ADMISSION DATE:    02/08/2014 DISCHARGE DATE:   02/09/2014  ADMISSION DIAGNOSIS: osteoarthritis right knee    DISCHARGE DIAGNOSIS:  osteoarthritis right knee    ADDITIONAL DIAGNOSIS: Active Problems:   S/P total knee arthroplasty  Past Medical History  Diagnosis Date  . PONV (postoperative nausea and vomiting)   . Hypertension   . Arthritis   . Headache(784.0)     PROCEDURE: Procedure(s): RIGHT TOTAL KNEE ARTHROPLASTY on 02/08/2014  CONSULTS:     HISTORY:  See H&P in chart  HOSPITAL COURSE:  Heather BottomDawn Height is a 63 y.o. admitted on 02/08/2014 and found to have a diagnosis of osteoarthritis right knee.  After appropriate laboratory studies were obtained  they were taken to the operating room on 02/08/2014 and underwent Procedure(s): RIGHT TOTAL KNEE ARTHROPLASTY.   They were given perioperative antibiotics:  Anti-infectives   Start     Dose/Rate Route Frequency Ordered Stop   02/08/14 1700  vancomycin (VANCOCIN) IVPB 1000 mg/200 mL premix     1,000 mg 200 mL/hr over 60 Minutes Intravenous Every 12 hours 02/08/14 1215 02/08/14 1813   02/07/14 1136  vancomycin (VANCOCIN) 1,500 mg in sodium chloride 0.9 % 500 mL IVPB  Status:  Discontinued     1,500 mg 250 mL/hr over 120 Minutes Intravenous On call to O.R. 02/07/14 1136 02/08/14 1215    .  Tolerated the procedure well.  Placed with a foley intraoperatively.  Given Ofirmev at induction and for 48 hours.    POD# 1: Vital signs were stable.  Patient denied Chest pain, shortness of breath, or calf pain.  Patient was started on Lovenox 30 mg subcutaneously twice daily at 8am.  Consults to PT, OT, and care management were made.  The patient was weight  bearing as tolerated.  CPM was placed on the operative leg 0-90 degrees for 6-8 hours a day.  Incentive spirometry was taught.  Dressing was changed.  Marcaine pump and hemovac were discontinued.      POD #2, Continued  PT for ambulation and exercise program.  IV saline locked.  O2 discontinued.    The remainder of the hospital course was dedicated to ambulation and strengthening.   The patient was discharged on 1 day post op in  Good condition.  Blood products given:none  DIAGNOSTIC STUDIES: Recent vital signs: No data found.      Recent laboratory studies: No results found for this basename: WBC, HGB, HCT, PLT,  in the last 168 hours No results found for this basename: NA, K, CL, CO2, BUN, CREATININE, GLUCOSE, CALCIUM,  in the last 168 hours Lab Results  Component Value Date   INR 0.99 01/29/2014     Recent Radiographic Studies :  Dg Chest 2 View  01/29/2014   CLINICAL DATA:  Arthritis of the right knee. Pre operative respiratory exam appear  EXAM: CHEST  2 VIEW  COMPARISON:  None.  FINDINGS: The heart size and mediastinal contours are within normal limits. Both lungs are clear.  The visualized skeletal structures are unremarkable.  IMPRESSION: Normal exam.   Electronically Signed   By: Geanie CooleyJim  Maxwell M.D.   On: 01/29/2014 16:21   [place] Venous Img Lower Unilateral Right  02/12/2014   CLINICAL DATA:  Recent right knee arthroplasty. Persistent right leg pain and swelling.  EXAM: RIGHT LOWER EXTREMITY VENOUS DOPPLER ULTRASOUND  TECHNIQUE: Gray-scale sonography with graded compression, as well as color Doppler and duplex ultrasound were performed to evaluate the lower extremity deep venous systems from the level of the common femoral vein and including the common femoral, femoral, profunda femoral, popliteal and calf veins including the posterior tibial, peroneal and gastrocnemius veins when visible. The superficial great saphenous vein was also interrogated. Spectral Doppler was utilized to  evaluate flow at rest and with distal augmentation maneuvers in the common femoral, femoral and popliteal veins.  COMPARISON:  None.  FINDINGS: Common Femoral Vein: No evidence of thrombus. Normal compressibility, respiratory phasicity and response to augmentation. Increased pulsatility of the venous waveform.  Saphenofemoral Junction: No evidence of thrombus. Normal compressibility and flow on color Doppler imaging.  Profunda Femoral Vein: No evidence of thrombus. Normal compressibility and flow on color Doppler imaging.  Femoral Vein: No evidence of thrombus. Normal compressibility, respiratory phasicity and response to augmentation. Increased pulsatility of the venous waveform.  Popliteal Vein: No evidence of thrombus. Normal compressibility, respiratory phasicity and response to augmentation.  Calf Veins: No evidence of thrombus. Normal compressibility and flow on color Doppler imaging.  Superficial Great Saphenous Vein: No evidence of thrombus. Normal compressibility and flow on color Doppler imaging.  Venous Reflux:  None.  Other Findings:  None.  IMPRESSION: 1. No evidence for deep venous thrombosis. 2. Increased pulsatility of the venous waveform is nonspecific but suggests elevated right heart pressures. Common differential considerations include tricuspid regurgitation, right heart strain, pulmonary arterial hypertension and COPD.   Electronically Signed   By: Malachy MoanHeath  McCullough M.D.   On: 02/12/2014 17:05    DISCHARGE INSTRUCTIONS: Discharge Orders   Future Orders Complete By Expires   Call MD / Call 911  As directed    Change dressing  As directed    Constipation Prevention  As directed    CPM  As directed    Diet - low sodium heart healthy  As directed    Do not put a pillow under the knee. Place it under the heel.  As directed    Driving restrictions  As directed    Increase activity slowly as tolerated  As directed    Lifting restrictions  As directed    TED hose  As directed        DISCHARGE MEDICATIONS:     Medication List         amLODipine 5 MG tablet  Commonly known as:  NORVASC  Take 5 mg by mouth daily.     celecoxib 200 MG capsule  Commonly known as:  CELEBREX  Take 1 capsule (200 mg total) by mouth every 12 (twelve) hours.     enoxaparin 40 MG/0.4ML injection  Commonly known as:  LOVENOX  Inject 0.4 mLs (40 mg total) into the skin daily.     Glucosamine 500 MG Caps  Take 1,000 mg by mouth daily.     losartan 50 MG tablet  Commonly known as:  COZAAR  Take 50 mg by mouth 2 (two) times daily.     magnesium oxide 400 MG tablet  Commonly known as:  MAG-OX  Take 800 mg by mouth  daily.     methocarbamol 500 MG tablet  Commonly known as:  ROBAXIN  Take 1-2 tablets (500-1,000 mg total) by mouth every 6 (six) hours as needed for muscle spasms.     metoCLOPramide 5 MG tablet  Commonly known as:  REGLAN  Take 1-2 tablets (5-10 mg total) by mouth every 8 (eight) hours as needed for nausea (if ondansetron (ZOFRAN) ineffective.).     multivitamin with minerals Tabs tablet  Take 3 tablets by mouth daily.     NIACINAMIDE PO  Take 1 tablet by mouth daily. Each tablet is 10 mg     oxyCODONE 5 MG immediate release tablet  Commonly known as:  Oxy IR/ROXICODONE  Take 1-2 tablets (5-10 mg total) by mouth every 4 (four) hours as needed for breakthrough pain.     OxyCODONE 10 mg T12a 12 hr tablet  Commonly known as:  OXYCONTIN  Take 1 tablet (10 mg total) by mouth every 12 (twelve) hours.     VITAMIN C PO  Take 3 tablets by mouth daily. Each tablet is 17 mg        FOLLOW UP VISIT:       Follow-up Information   Follow up with Raymon Mutton, MD. Call on 02/23/2014.   Specialty:  Orthopedic Surgery   Contact information:   [address] [place] [address] [phone]       Follow up with [facility]. (Someone from [facility] will contact you concerning start date and time for physical therapy.)     Contact information:   [address] [place] [address] [phone]       DISPOSITION: HOME  CONDITION:  Noralyn Pick 02/17/2014, 7:49 PM

## 2014-02-25 ENCOUNTER — Ambulatory Visit: Payer: Managed Care, Other (non HMO) | Attending: Orthopedic Surgery | Admitting: Physical Therapy

## 2014-02-25 DIAGNOSIS — I10 Essential (primary) hypertension: Secondary | ICD-10-CM | POA: Diagnosis not present

## 2014-02-25 DIAGNOSIS — M25569 Pain in unspecified knee: Secondary | ICD-10-CM | POA: Insufficient documentation

## 2014-02-25 DIAGNOSIS — Z96659 Presence of unspecified artificial knee joint: Secondary | ICD-10-CM | POA: Diagnosis not present

## 2014-02-25 DIAGNOSIS — IMO0001 Reserved for inherently not codable concepts without codable children: Secondary | ICD-10-CM | POA: Insufficient documentation

## 2014-02-25 DIAGNOSIS — R609 Edema, unspecified: Secondary | ICD-10-CM | POA: Diagnosis not present

## 2014-02-25 DIAGNOSIS — M25669 Stiffness of unspecified knee, not elsewhere classified: Secondary | ICD-10-CM | POA: Insufficient documentation

## 2014-02-25 DIAGNOSIS — R262 Difficulty in walking, not elsewhere classified: Secondary | ICD-10-CM | POA: Insufficient documentation

## 2014-03-01 ENCOUNTER — Ambulatory Visit: Payer: Managed Care, Other (non HMO) | Admitting: Rehabilitation

## 2014-03-01 DIAGNOSIS — IMO0001 Reserved for inherently not codable concepts without codable children: Secondary | ICD-10-CM | POA: Diagnosis not present

## 2014-03-03 ENCOUNTER — Ambulatory Visit: Payer: Managed Care, Other (non HMO) | Admitting: Rehabilitation

## 2014-03-03 DIAGNOSIS — IMO0001 Reserved for inherently not codable concepts without codable children: Secondary | ICD-10-CM | POA: Diagnosis not present

## 2014-03-08 ENCOUNTER — Ambulatory Visit: Payer: Managed Care, Other (non HMO) | Admitting: Rehabilitation

## 2014-03-08 DIAGNOSIS — IMO0001 Reserved for inherently not codable concepts without codable children: Secondary | ICD-10-CM | POA: Diagnosis not present

## 2014-03-11 ENCOUNTER — Ambulatory Visit: Payer: Managed Care, Other (non HMO) | Admitting: Rehabilitation

## 2014-03-11 DIAGNOSIS — IMO0001 Reserved for inherently not codable concepts without codable children: Secondary | ICD-10-CM | POA: Diagnosis not present

## 2014-03-16 ENCOUNTER — Ambulatory Visit: Payer: Managed Care, Other (non HMO) | Admitting: Rehabilitation

## 2014-03-16 DIAGNOSIS — IMO0001 Reserved for inherently not codable concepts without codable children: Secondary | ICD-10-CM | POA: Diagnosis not present

## 2014-03-18 ENCOUNTER — Ambulatory Visit: Payer: Managed Care, Other (non HMO) | Admitting: Rehabilitation

## 2014-03-18 DIAGNOSIS — IMO0001 Reserved for inherently not codable concepts without codable children: Secondary | ICD-10-CM | POA: Diagnosis not present

## 2014-03-22 ENCOUNTER — Ambulatory Visit: Payer: Managed Care, Other (non HMO) | Admitting: Physical Therapy

## 2014-03-23 ENCOUNTER — Ambulatory Visit: Payer: Managed Care, Other (non HMO) | Attending: Orthopedic Surgery | Admitting: Rehabilitation

## 2014-03-23 DIAGNOSIS — IMO0001 Reserved for inherently not codable concepts without codable children: Secondary | ICD-10-CM | POA: Insufficient documentation

## 2014-03-23 DIAGNOSIS — R609 Edema, unspecified: Secondary | ICD-10-CM | POA: Diagnosis not present

## 2014-03-23 DIAGNOSIS — M25569 Pain in unspecified knee: Secondary | ICD-10-CM | POA: Diagnosis not present

## 2014-03-23 DIAGNOSIS — R262 Difficulty in walking, not elsewhere classified: Secondary | ICD-10-CM | POA: Diagnosis not present

## 2014-03-23 DIAGNOSIS — M25669 Stiffness of unspecified knee, not elsewhere classified: Secondary | ICD-10-CM | POA: Insufficient documentation

## 2014-03-23 DIAGNOSIS — I10 Essential (primary) hypertension: Secondary | ICD-10-CM | POA: Insufficient documentation

## 2014-03-23 DIAGNOSIS — Z96659 Presence of unspecified artificial knee joint: Secondary | ICD-10-CM | POA: Diagnosis not present

## 2014-03-25 ENCOUNTER — Ambulatory Visit: Payer: Managed Care, Other (non HMO) | Admitting: Rehabilitation

## 2014-03-25 DIAGNOSIS — IMO0001 Reserved for inherently not codable concepts without codable children: Secondary | ICD-10-CM | POA: Diagnosis not present

## 2014-03-29 ENCOUNTER — Ambulatory Visit: Payer: Managed Care, Other (non HMO) | Admitting: Physical Therapy

## 2014-03-29 DIAGNOSIS — IMO0001 Reserved for inherently not codable concepts without codable children: Secondary | ICD-10-CM | POA: Diagnosis not present

## 2014-04-01 ENCOUNTER — Ambulatory Visit: Payer: Managed Care, Other (non HMO) | Admitting: Rehabilitation

## 2014-04-01 DIAGNOSIS — IMO0001 Reserved for inherently not codable concepts without codable children: Secondary | ICD-10-CM | POA: Diagnosis not present

## 2014-04-06 ENCOUNTER — Ambulatory Visit: Payer: Managed Care, Other (non HMO) | Admitting: Rehabilitation

## 2014-04-06 DIAGNOSIS — IMO0001 Reserved for inherently not codable concepts without codable children: Secondary | ICD-10-CM | POA: Diagnosis not present

## 2014-04-07 ENCOUNTER — Ambulatory Visit: Payer: Managed Care, Other (non HMO) | Admitting: Physical Therapy

## 2014-04-07 DIAGNOSIS — IMO0001 Reserved for inherently not codable concepts without codable children: Secondary | ICD-10-CM | POA: Diagnosis not present

## 2015-10-26 ENCOUNTER — Encounter: Payer: Self-pay | Admitting: Physical Therapy

## 2015-10-26 ENCOUNTER — Ambulatory Visit: Payer: Managed Care, Other (non HMO) | Attending: Orthopedic Surgery | Admitting: Physical Therapy

## 2015-10-26 DIAGNOSIS — R29898 Other symptoms and signs involving the musculoskeletal system: Secondary | ICD-10-CM

## 2015-10-26 DIAGNOSIS — M25561 Pain in right knee: Secondary | ICD-10-CM | POA: Insufficient documentation

## 2015-10-26 DIAGNOSIS — M25551 Pain in right hip: Secondary | ICD-10-CM | POA: Insufficient documentation

## 2015-10-26 DIAGNOSIS — R262 Difficulty in walking, not elsewhere classified: Secondary | ICD-10-CM | POA: Insufficient documentation

## 2015-10-26 NOTE — Therapy (Signed)
[facility] [address]  [address] [place], [place], [address] Phone: [phone]   Fax:  [phone]  Physical Therapy Evaluation  Patient Details  Name: Heather Mueller MRN: [id number] Date of Birth: 06-23-1962 Referring Provider: Darcella Cheshire  Encounter Date: 10/26/2015      PT End of Session - 10/26/15 1453    Visit Number 1   Number of Visits 6   Date for PT Re-Evaluation 12/07/15   PT Start Time 1404   PT Stop Time 1451   PT Time Calculation (min) 47 min      Past Medical History  Diagnosis Date  . PONV (postoperative nausea and vomiting)   . Hypertension   . Arthritis   . ONGEXBMW(413.2)     Past Surgical History  Procedure Laterality Date  . Abdominal hysterectomy      partial  . Colonoscopy w/ biopsies and polypectomy    . Breast surgery      Lumpectomy x 2 right breast  . Bladder suspension      Hx: x 2  . Total knee arthroplasty Right 02/08/2014    Procedure: RIGHT TOTAL KNEE ARTHROPLASTY;  Surgeon: Dannielle Huh, MD;  Location: [facility] OR;  Service: Orthopedics;  Laterality: Right;    There were no vitals filed for this visit.  Visit Diagnosis:  Right hip pain  Right knee pain  Weakness of right hip  Difficulty walking      Subjective Assessment - 10/26/15 1411    Subjective Pt underwent R TKA in April 2015 and this responded well to rehab. However, 3-4 months ago she began having pain to R thigh and anterior R hip.  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   Pertinent History  R TKA April 2015, L Knee OA pending TKA (not scheduled)   Diagnostic tests x-rays reveal moderate OA to R hip per pt report   Patient Stated Goals improve strength and decrease pain in R LE   Currently in Pain? Yes   Pain Score --  AVG pain 6-7/10 and worst pain up to 9/10 lately   Pain Location Hip   Pain Orientation Right;Anterior   Pain Descriptors / Indicators Sharp   Pain Radiating  Towards pain extends anterior hip to knee   Pain Onset More than a month ago   Pain Frequency Intermittent   Aggravating Factors  prolonged sitting or standing, hip flexion AROM, rolling in bed, worsens throughout the day, walking up incline   Pain Relieving Factors Aleve            [facility] PT Assessment - 10/26/15 0001    Assessment   Medical Diagnosis R Hip OA   Referring Provider Darcella Cheshire   Onset Date/Surgical Date 05/23/15   Balance Screen   Has the patient fallen in the past 6 months No   Has the patient had a decrease in activity level because of a fear of falling?  No   Is the patient reluctant to leave their home because of a fear of falling?  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   Leisure take care of young grandchildren 3 months to 52 y/o   Observation/Other Assessments   Focus on Therapeutic Outcomes (FOTO)  56% limitation   ROM / Strength   AROM / PROM / Strength Strength  Strength   Strength Assessment Site Hip;Knee   Right/Left Hip Right   Right Hip Flexion 4/5  mild anterior hip pain   Right Hip Extension 4/5  no pain   Right Hip External Rotation  4/5   Right Hip Internal Rotation 5/5   Right Hip ABduction 4/5  anteroir/lateral hip pain   Right Hip ADduction 4+/5  groin pain   Right/Left Knee Right   Right Knee Flexion 4+/5   Right Knee Extension 4+/5   Special Tests    Special Tests --  POS            TODAY'S TREATMENT TherEx - Bridge 10x Seated Hip ABD black TB 15x Bynum Bellows stretch                PT Education - 10/26/15 1819    Education provided Yes   Education Details initial HEP   Person(s) Educated Patient   Methods Explanation;Demonstration;Handout   Comprehension Verbalized understanding;Returned demonstration             PT Long Term Goals - 10/26/15 1835    PT LONG TERM GOAL #1   Title pt  independent with advanced HEP as necessary by 12/07/15   Status New   PT LONG TERM GOAL #2   Title pt reports intermittent pain no greater than 2/10 to R LE by 12/07/15   Status New   PT LONG TERM GOAL #3   Title pt able to sleep without limitation by pain by 12/07/15   Status New   PT LONG TERM GOAL #4   Title pt able to ambulate over level terrain and up/down inclines without limitation by R LE pain by 12/07/15   Status New   PT LONG TERM GOAL #5   Title R LE MMT 4+/5 or greater by 12/07/15   Status New               Plan - 10/26/15 1414    Clinical Impression Statement Pt sent to OPPT due to c/o R anterior hip pain which extends along anterior thigh to medial R Knee. She states pain averages 6-7/10 when present and up to 9/10 at times.  Pain increases with prolonged static postures (sitting or standing), rolling in bed, prolonged walking, walking up inclines.  Significant TTP to R pes anserine which continues proximally along gracilis and sartorius to anterior hip and groin area.  Pt reports recent x-rays indicate R hip OA; however, R hip PROM pain-free and WFL today.  However, weakness noted throughout R hip and pt noted hip pain with tesing into Flexion, ABD, and ADD. Her gait is antalgic on R with shortened L stride with pt c/o R anterior thigh pulling pain at terminal stance.  Mild tightness noted in hip flexors and significant tightness noted in ITB.  She should respond well with PT to decrease her symptoms in the next 4-6 weeks.   Pt will benefit from skilled therapeutic intervention in order to improve on the following deficits Pain;Decreased strength;Abnormal gait;Decreased mobility;Difficulty walking   Rehab Potential Good   Clinical Impairments Affecting Rehab Potential obesity   PT Frequency 1x / week   PT Duration 6 weeks   PT Treatment/Interventions Therapeutic exercise;Therapeutic activities;Manual techniques;Iontophoresis 4mg /ml Dexamethasone;Taping;Balance  training;Neuromuscular re-education;Patient/family education;Functional mobility training;Stair training;Gait training;Cryotherapy;Electrical Stimulation   PT Next Visit Plan ITB and iliopsoas mobility, hip stability training, manual to gracilis and/or sartorius to assist with pain PRN; knee flexion and extension strengthening.   Consulted and Agree with Plan  of Care Patient          G-Codes - 10/26/15 1824    Functional Assessment Tool Used foto 56% limitation   Functional Limitation Mobility: Walking and moving around   Mobility: Walking and Moving Around Current Status [phone](G8978) At least 40 percent but less than 60 percent impaired, limited or restricted   Mobility: Walking and Moving Around Goal Status [phone](G8979) At least 20 percent but less than 40 percent impaired, limited or restricted       Problem List Patient Active Problem List   Diagnosis Date Noted  . S/P total knee arthroplasty 02/08/2014    [facility]Keyontae Huckeby PT, OCS 10/26/2015, 6:37 PM  [facility] [address]  [address] [place], [place], [address] Phone: [phone]   Fax:  [phone]  Name: Eliezer BottomDawn Butch MRN: [id number] Date of Birth: [birth year]

## 2015-11-02 ENCOUNTER — Ambulatory Visit: Payer: Managed Care, Other (non HMO) | Admitting: Physical Therapy

## 2015-11-09 ENCOUNTER — Ambulatory Visit: Payer: Managed Care, Other (non HMO) | Admitting: Physical Therapy

## 2015-11-09 DIAGNOSIS — M25551 Pain in right hip: Secondary | ICD-10-CM | POA: Diagnosis not present

## 2015-11-09 DIAGNOSIS — R29898 Other symptoms and signs involving the musculoskeletal system: Secondary | ICD-10-CM

## 2015-11-09 DIAGNOSIS — R262 Difficulty in walking, not elsewhere classified: Secondary | ICD-10-CM

## 2015-11-09 DIAGNOSIS — M25561 Pain in right knee: Secondary | ICD-10-CM

## 2015-11-09 NOTE — Therapy (Signed)
[facility] [address]  [address] [place], [place], [address] Phone: [phone]   Fax:  [phone]  Physical Therapy Treatment  Patient Details  Name: Lenka Zhao MRN: [id number] Date of Birth: 26-Sep-1951 Referring Provider: Darcella Cheshire  Encounter Date: 11/09/2015      PT End of Session - 11/09/15 1448    Visit Number 2   Number of Visits 6   Date for PT Re-Evaluation 12/07/15   PT Start Time 1405   PT Stop Time 1447   PT Time Calculation (min) 42 min      Past Medical History  Diagnosis Date  . PONV (postoperative nausea and vomiting)   . Hypertension   . Arthritis   . ONGEXBMW(413.2)     Past Surgical History  Procedure Laterality Date  . Abdominal hysterectomy      partial  . Colonoscopy w/ biopsies and polypectomy    . Breast surgery      Lumpectomy x 2 right breast  . Bladder suspension      Hx: x 2  . Total knee arthroplasty Right 02/08/2014    Procedure: RIGHT TOTAL KNEE ARTHROPLASTY;  Surgeon: Dannielle Huh, MD;  [facility]: [facility];  Service: Orthopedics;  Laterality: Right;    There were no vitals filed for this visit.  Visit Diagnosis:  Right hip pain  Right knee pain  Weakness of right hip  Difficulty walking      Subjective Assessment - 11/09/15 1414    Subjective States has been feeling good lately and pain down to 2-3/10 on AVG lately.  Has been performing HEP.   Currently in Pain? Yes   Pain Score --  2-3/10 lately   Pain Location Hip   Pain Orientation Right            TODAY'S TREATMENT Manual - Strumming R ITB, MFR/Stretch R Iliopsoas in Mod Thomas  TherEx - NuStep lvl 4, 4' B FOB (55cm) Tuck AROM and Bridge combo 12x Bridge with Black TB at [employer] Bridge with Black TB at knees and unilateral hip ABD 3x5sets Side-Lying Clam Blue TB 12x each TRX DL Squat 44W (mild anterior/medial R knee pain) Fitter BW Lunge 1 Black + 1 Blue 15x each with B pole  A              PT Long Term Goals - 11/09/15 1606    PT LONG TERM GOAL #1   Title pt independent with advanced HEP as necessary by 12/07/15   Status On-going   PT LONG TERM GOAL #2   Title pt reports intermittent pain no greater than 2/10 to R LE by 12/07/15   Status On-going   PT LONG TERM GOAL #3   Title pt able to sleep without limitation by pain by 12/07/15   Status On-going   PT LONG TERM GOAL #4   Title pt able to ambulate over level terrain and up/down inclines without limitation by R LE pain by 12/07/15   Status On-going   PT LONG TERM GOAL #5   Title R LE MMT 4+/5 or greater by 12/07/15   Status On-going               Plan - 11/09/15 1605    Clinical Impression Statement good progress since beginning PT with large decrease in pain already.  Tenderness persists to R pes anserine area but quite mild today.  Exercises well tolerated only noting mild R medial knee pain with  squatting.   PT Next Visit Plan ITB and iliopsoas mobility, hip stability training, manual to gracilis and/or sartorius to assist with pain PRN; knee flexion and extension strengthening.   Consulted and Agree with Plan of Care Patient        Problem List Patient Active Problem List   Diagnosis Date Noted  . S/P total knee arthroplasty 02/08/2014    [facility] PT, OCS 11/09/2015, 4:07 PM  [facility] [address]  [address] [place], [place], [address] Phone: [phone]   Fax:  [phone]  Name: Marion Seese MRN: [id number] Date of Birth: [birth year]

## 2015-11-12 IMAGING — CR DG CHEST 2V
2 series · 2 of 2 positions shown · non-contrast
Comparison: None.

CLINICAL DATA: Arthritis of the right knee. Pre operative
respiratory exam appear

EXAM:
CHEST  2 VIEW

[w chest pa]
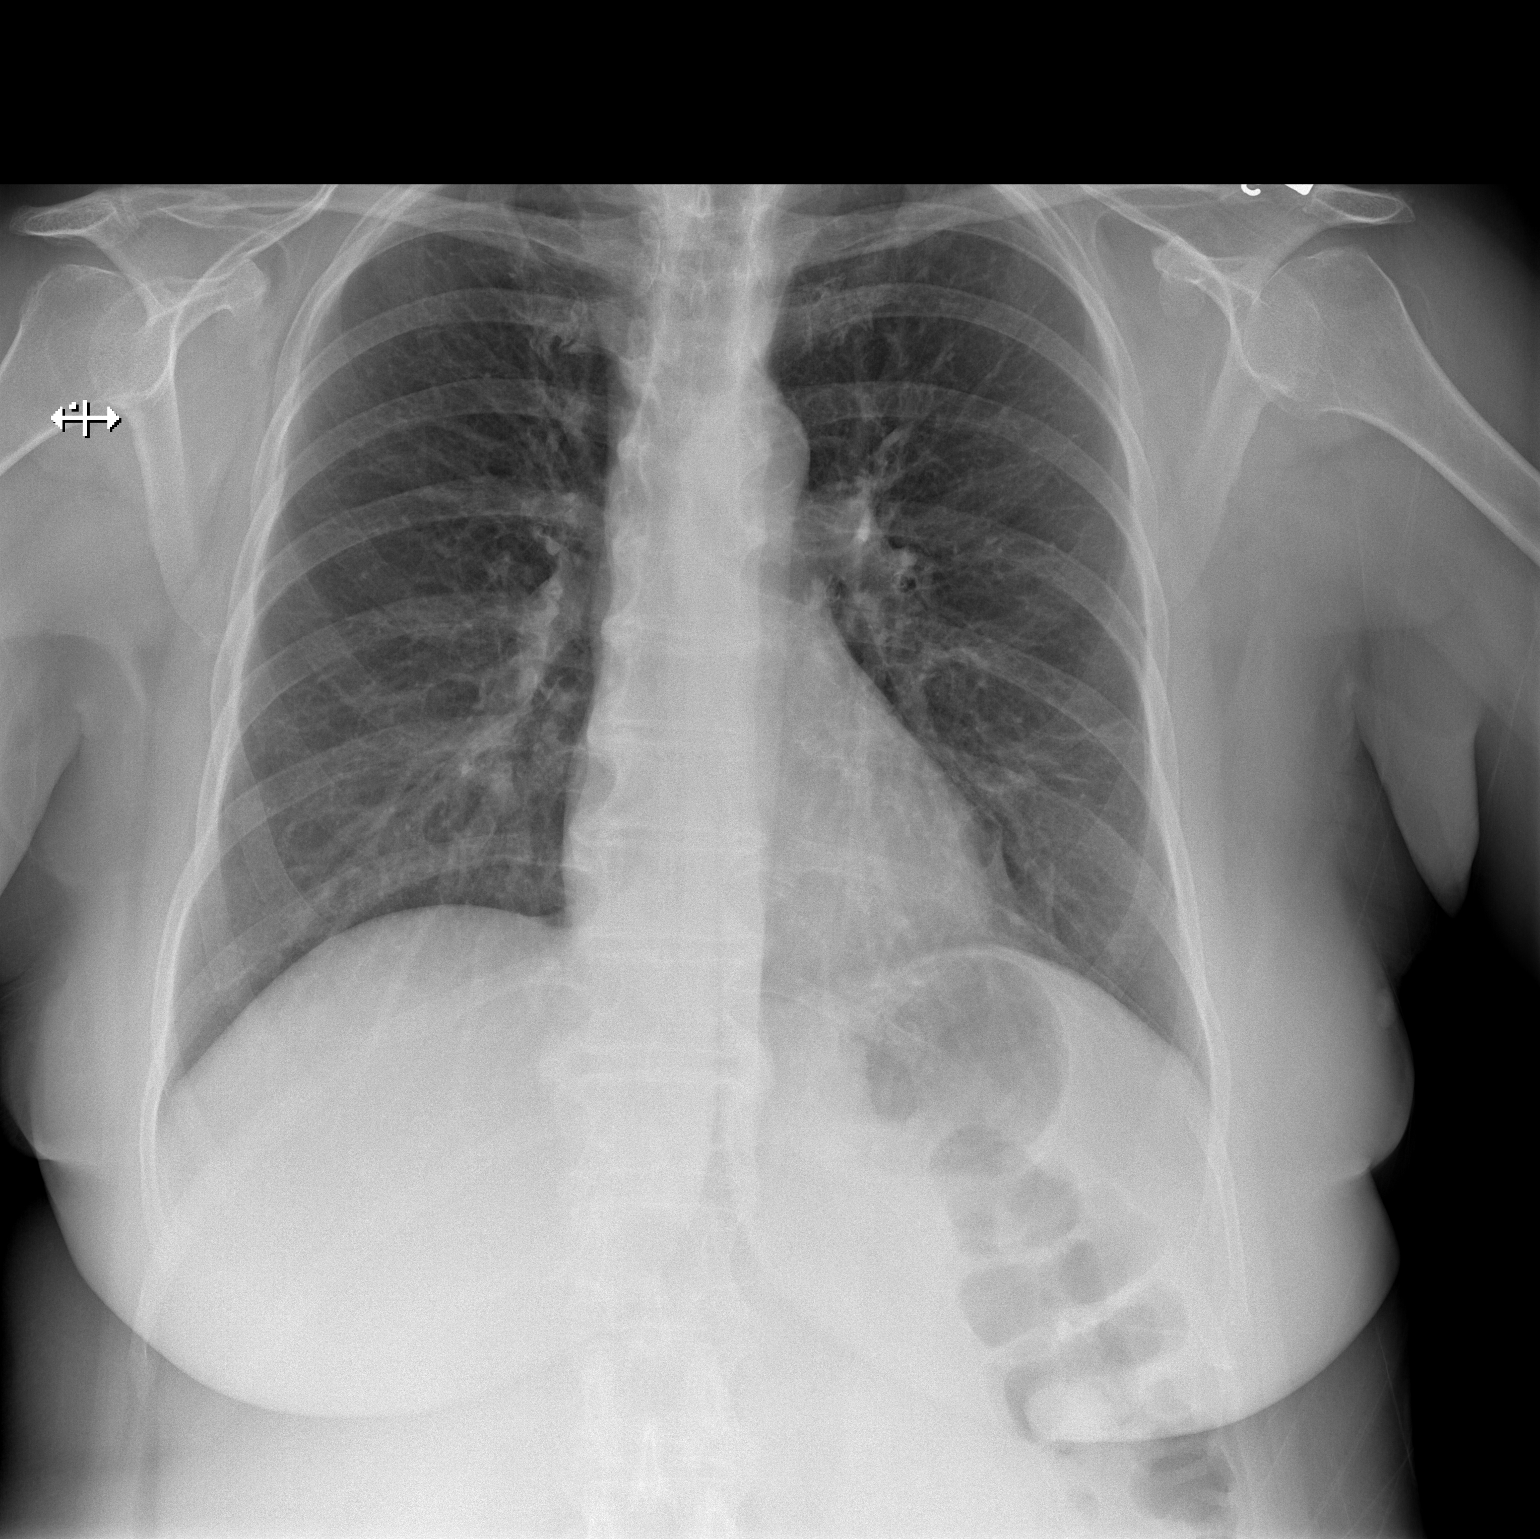

[w chest lat]
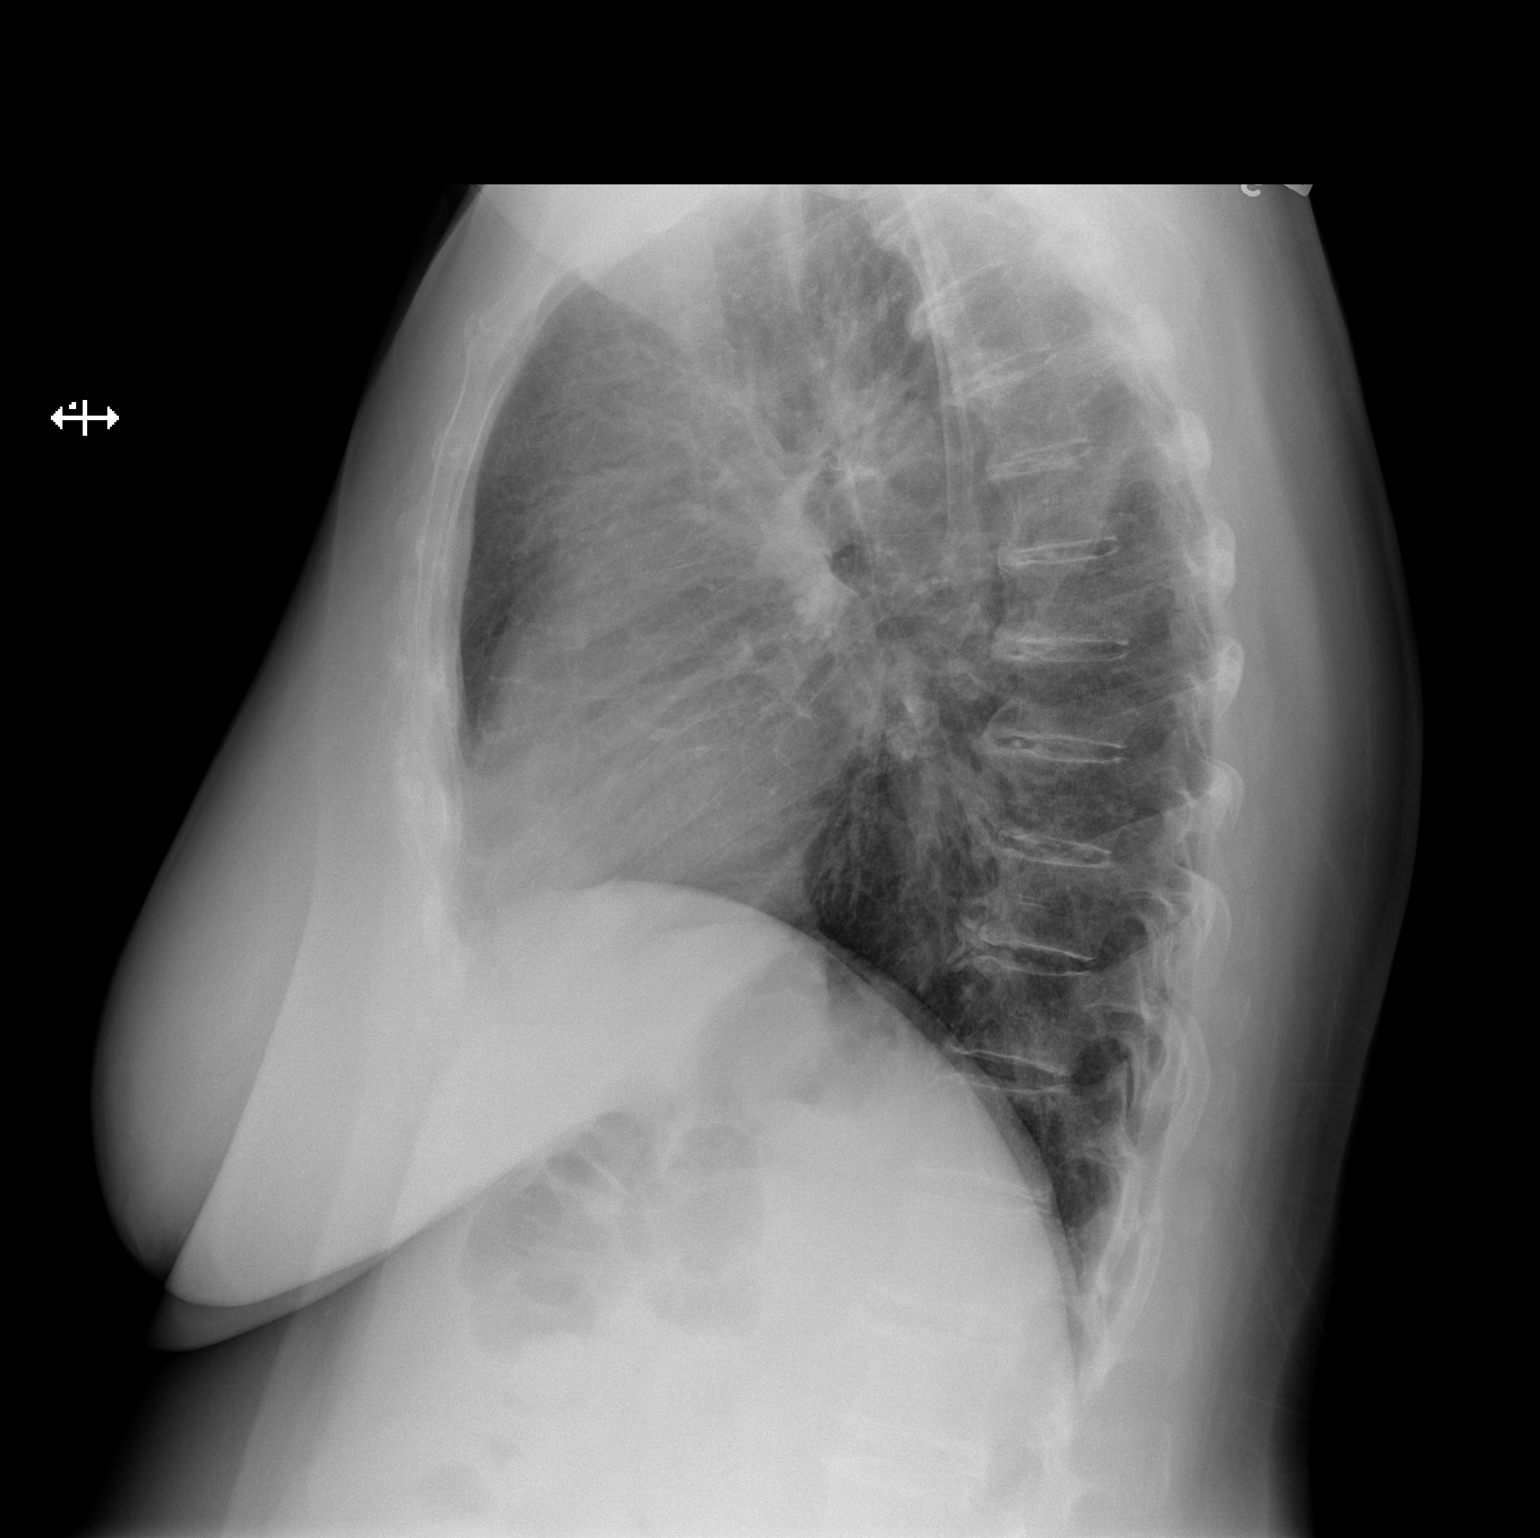

[2 of 2 positions shown; findings below may reference images not displayed]

FINDINGS: The heart size and mediastinal contours are within normal limits.
Both lungs are clear. The visualized skeletal structures are
unremarkable.
IMPRESSION: Normal exam.

## 2015-11-16 ENCOUNTER — Ambulatory Visit: Payer: Managed Care, Other (non HMO) | Admitting: Physical Therapy

## 2015-11-16 DIAGNOSIS — M25551 Pain in right hip: Secondary | ICD-10-CM | POA: Diagnosis not present

## 2015-11-16 DIAGNOSIS — R262 Difficulty in walking, not elsewhere classified: Secondary | ICD-10-CM

## 2015-11-16 DIAGNOSIS — R29898 Other symptoms and signs involving the musculoskeletal system: Secondary | ICD-10-CM

## 2015-11-16 NOTE — Therapy (Signed)
[facility] [address]  [address] [place], [place], [address] Phone: [phone]   Fax:  [phone]  Physical Therapy Treatment  Patient Details  Name: Heather Mueller MRN: [id number] Date of Birth: [birth year] Referring Provider: Darcella Cheshire  Encounter Date: 11/16/2015      PT End of Session - 11/16/15 1034    Visit Number 3   Number of Visits 6   Date for PT Re-Evaluation 12/07/15   PT Start Time 1025   PT Stop Time 1103   PT Time Calculation (min) 38 min      Past Medical History  Diagnosis Date  . PONV (postoperative nausea and vomiting)   . Hypertension   . Arthritis   . ONGEXBMW(413.2)     Past Surgical History  Procedure Laterality Date  . Abdominal hysterectomy      partial  . Colonoscopy w/ biopsies and polypectomy    . Breast surgery      Lumpectomy x 2 right breast  . Bladder suspension      Hx: x 2  . Total knee arthroplasty Right 02/08/2014    Procedure: RIGHT TOTAL KNEE ARTHROPLASTY;  Surgeon: Dannielle Huh, MD;  [facility];  Service: Orthopedics;  Laterality: Right;    There were no vitals filed for this visit.  Visit Diagnosis:  Right hip pain  Weakness of right hip  Difficulty walking      Subjective Assessment - 11/16/15 1032    Subjective Reports pain up to 7/10 at end of work day but otherwise 3/10 on AVG.  Pain localized to anterior R hip lately stating does not extend to knee much.   Currently in Pain? Yes   Pain Score 3    Pain Location Hip   Pain Orientation Right;Anterior  pain mostly localized to anterior hip lately              TODAY'S TREATMENT TherEx - NuStep lvl 5, 5' B FOB (55cm) Tuck AROM and Bridge combo 12x  Manual - MFR throughout R pec/abdominal with emphasis on hip flexors; Manual stretching R Iliopsoas, RF, and Sartorius in [employer]; Strumming R ITB and Medial/inferior patellar glides grade 3 and 4  TherEx - Side-Lying Clam Blue TB 15x  each TRX DL Squat with Blue TB at knees 10x TRX Assisted Standing Clam Blue TB at knees 10x each B Knee Flexion Machine 25# 15x B Knee Extension Machine 20# 15x            PT Long Term Goals - 11/09/15 1606    PT LONG TERM GOAL #1   Title pt independent with advanced HEP as necessary by 12/07/15   Status On-going   PT LONG TERM GOAL #2   Title pt reports intermittent pain no greater than 2/10 to R LE by 12/07/15   Status On-going   PT LONG TERM GOAL #3   Title pt able to sleep without limitation by pain by 12/07/15   Status On-going   PT LONG TERM GOAL #4   Title pt able to ambulate over level terrain and up/down inclines without limitation by R LE pain by 12/07/15   Status On-going   PT LONG TERM GOAL #5   Title R LE MMT 4+/5 or greater by 12/07/15   Status On-going               Plan - 11/16/15 1122    Clinical Impression Statement Performed well today without noting knee pain during  squats today.  Still with tightness and pain throughout R anterior hip but this too improving and only noted pain/discomfort during stretching to this area.   PT Next Visit Plan ITB and iliopsoas mobility, hip stability training, manual to gracilis and/or sartorius to assist with pain PRN; knee flexion and extension strengthening.   Consulted and Agree with Plan of Care Patient        Problem List Patient Active Problem List   Diagnosis Date Noted  . S/P total knee arthroplasty 02/08/2014    [facility] PT, OCS 11/17/2015, 11:57 AM  [facility] [address]  [address] [place], [place], [address] Phone: [phone]   Fax:  [phone]  Name: Jenica Costilow MRN: [id number] Date of Birth: 12/26/1950

## 2015-11-23 ENCOUNTER — Ambulatory Visit: Payer: Managed Care, Other (non HMO) | Attending: Orthopedic Surgery | Admitting: Physical Therapy

## 2015-11-23 DIAGNOSIS — M25561 Pain in right knee: Secondary | ICD-10-CM | POA: Insufficient documentation

## 2015-11-23 DIAGNOSIS — M25551 Pain in right hip: Secondary | ICD-10-CM | POA: Diagnosis present

## 2015-11-23 DIAGNOSIS — R262 Difficulty in walking, not elsewhere classified: Secondary | ICD-10-CM | POA: Diagnosis present

## 2015-11-23 DIAGNOSIS — R29898 Other symptoms and signs involving the musculoskeletal system: Secondary | ICD-10-CM | POA: Insufficient documentation

## 2015-11-23 NOTE — Therapy (Signed)
[facility] [address]  [address] [place], [place], [address] Phone: [phone]   Fax:  [phone]  Physical Therapy Treatment  Patient Details  Name: Heather Mueller MRN: [id number] Date of Birth: Mar 11, 1951 Referring Provider: Darcella Cheshire  Encounter Date: 11/23/2015      PT End of Session - 11/23/15 1400    Visit Number 4   Number of Visits 6   Date for PT Re-Evaluation 12/07/15   PT Start Time 1400   PT Stop Time 1446   PT Time Calculation (min) 46 min      Past Medical History  Diagnosis Date  . PONV (postoperative nausea and vomiting)   . Hypertension   . Arthritis   . ONGEXBMW(413.2)     Past Surgical History  Procedure Laterality Date  . Abdominal hysterectomy      partial  . Colonoscopy w/ biopsies and polypectomy    . Breast surgery      Lumpectomy x 2 right breast  . Bladder suspension      Hx: x 2  . Total knee arthroplasty Right 02/08/2014    Procedure: RIGHT TOTAL KNEE ARTHROPLASTY;  Surgeon: Dannielle Huh, MD;  [facility]: [facility];  Service: Orthopedics;  Laterality: Right;    There were no vitals filed for this visit.  Visit Diagnosis:  Right hip pain  Weakness of right hip  Difficulty walking  Right knee pain      Subjective Assessment - 11/23/15 1407    Subjective States hip has been feeling good since last treatment stating pain down to 0-1/10 lately.   Currently in Pain? Yes   Pain Score --  0-1/10   Pain Location Hip   Pain Orientation Right;Anterior          TODAY'S TREATMENT  TherEx - NuStep lvl 5, 4'   Manual - MFR throughout R pec/abdominal with emphasis on hip flexors; Manual stretching with STM to R Iliopsoas RF in Mod Thomas; Strumming R ITB and Medial/inferior patellar glides grade 3 and 4 in L side-lying  TherEx - Side-Lying Clam Blue TB 15x each Side-lying Hip ABD 2# 15x each Fitter BW Lunge 2 Blue 15x with B Pole A TRX DL Squat 44W Monster Walk Blue TB at  ankles 2x20' Side-Stepping Blue TB at ankles 15' each B Knee Flexion Machine 25# 15x B Knee Extension Machine 20# 15x            PT Long Term Goals - 11/09/15 1606    PT LONG TERM GOAL #1   Title pt independent with advanced HEP as necessary by 12/07/15   Status On-going   PT LONG TERM GOAL #2   Title pt reports intermittent pain no greater than 2/10 to R LE by 12/07/15   Status On-going   PT LONG TERM GOAL #3   Title pt able to sleep without limitation by pain by 12/07/15   Status On-going   PT LONG TERM GOAL #4   Title pt able to ambulate over level terrain and up/down inclines without limitation by R LE pain by 12/07/15   Status On-going   PT LONG TERM GOAL #5   Title R LE MMT 4+/5 or greater by 12/07/15   Status On-going               Plan - 11/23/15 1428    Clinical Impression Statement Is progressing very well with pain and mobility in R hip being nearly pain-free lately.  Progressed exericses  today without pain and progressed HEP.  Will progress HEP further next visit if no increased pain following today's treatment.   PT Next Visit Plan ITB and iliopsoas mobility, hip stability training, knee flexion and extension strengthening; progress HEP   Consulted and Agree with Plan of Care Patient        Problem List Patient Active Problem List   Diagnosis Date Noted  . S/P total knee arthroplasty 02/08/2014    Tiawana Forgy PT, OCS 11/23/2015, 2:54 PM  [facility] [address]  [address] [place], [place], [address] Phone: [phone]   Fax:  [phone]  Name: Kelley Polinsky MRN: [id number] Date of Birth: 04-Apr-1951

## 2015-11-26 IMAGING — US US EXTREM LOW VENOUS*R*
1 series · 13 of 24 positions shown · non-contrast
Comparison: None.

CLINICAL DATA: Recent right knee arthroplasty. Persistent right leg
pain and swelling.



[Series 1: us extrem low venous*right* · 13 of 25 slices shown]
[im 1/25]
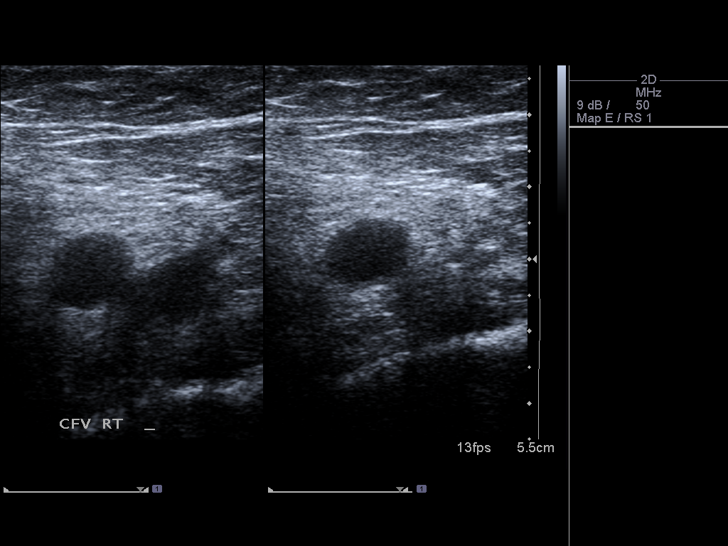
[im 3/25]
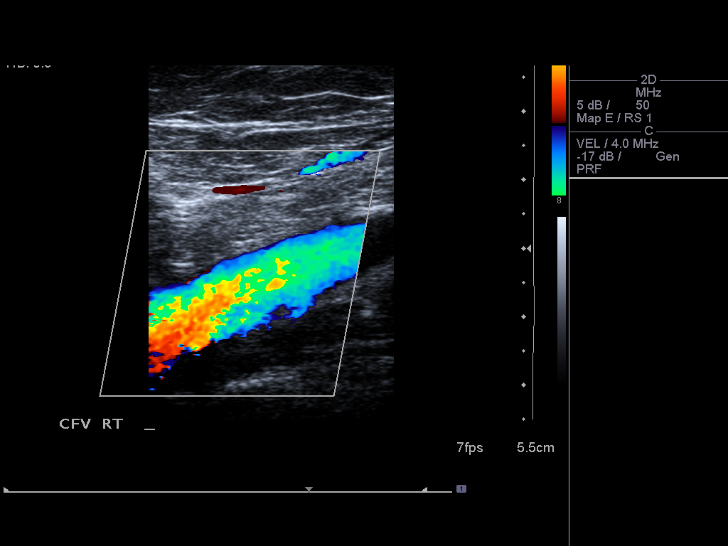
[im 5/25]
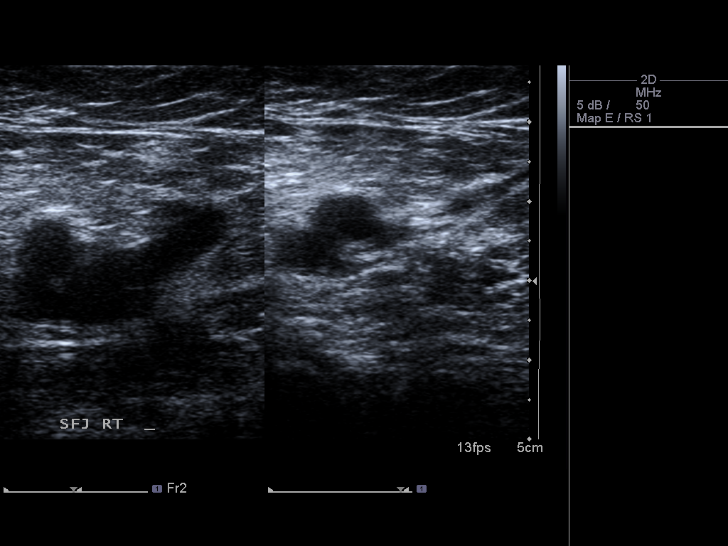
[im 7/25]
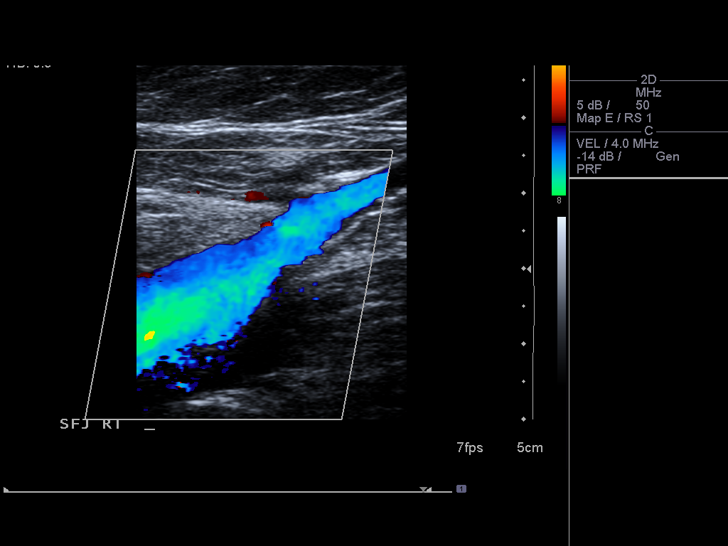
[im 9/25]
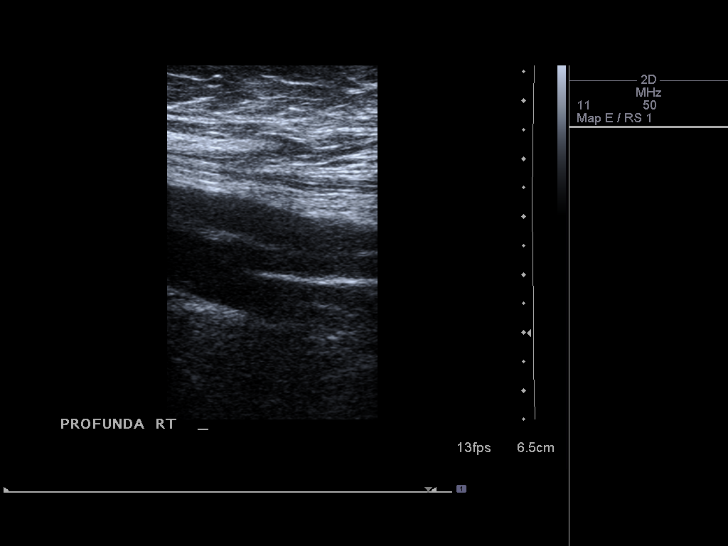
[im 11/25]
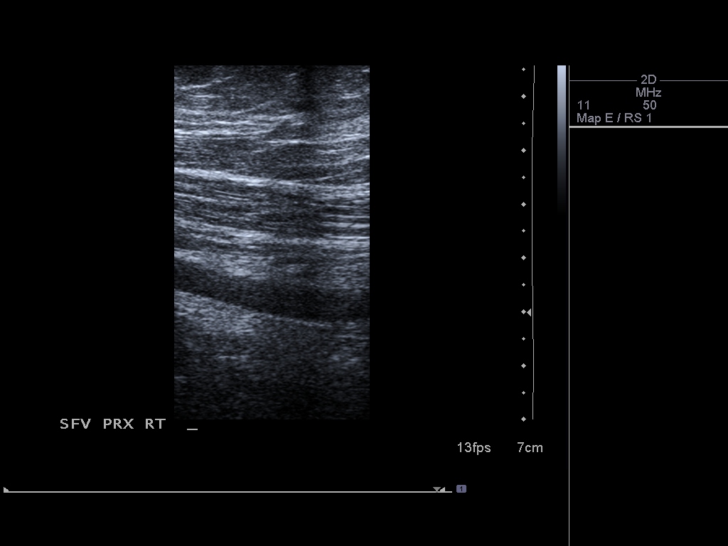
[im 13/25]
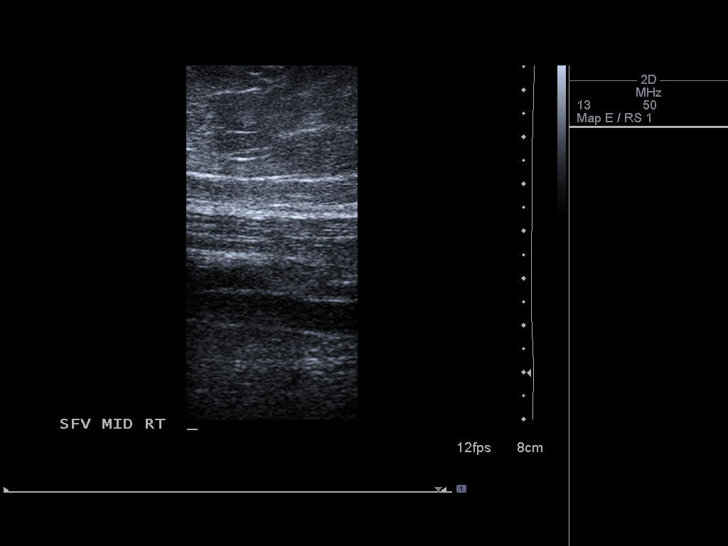
[im 14/25]
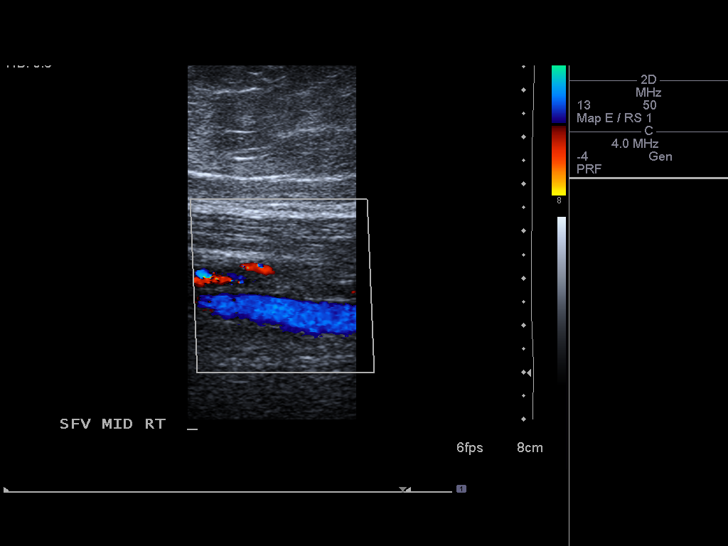
[im 16/25]
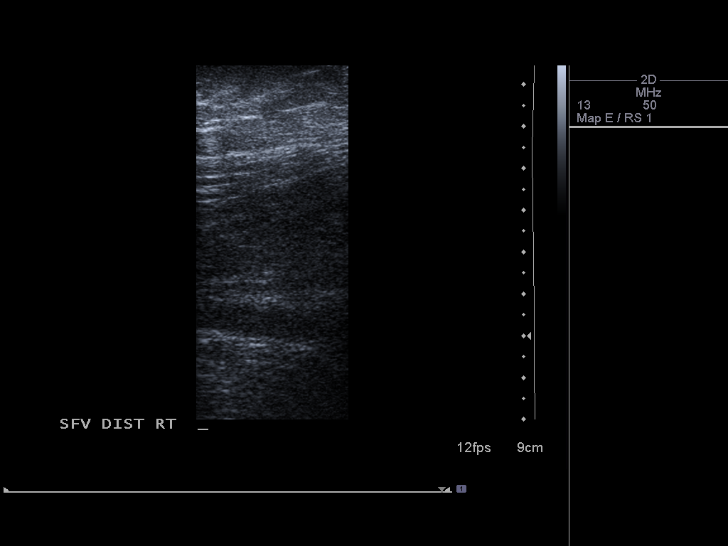
[im 18/25]
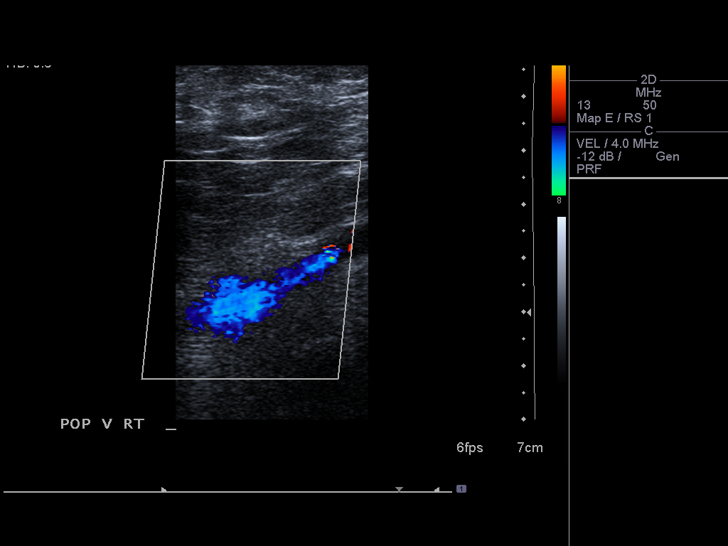
[im 20/25]
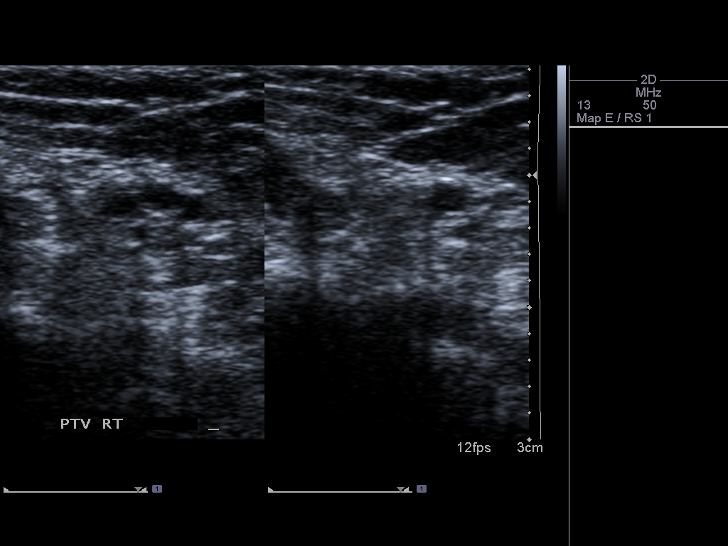
[im 22/25]
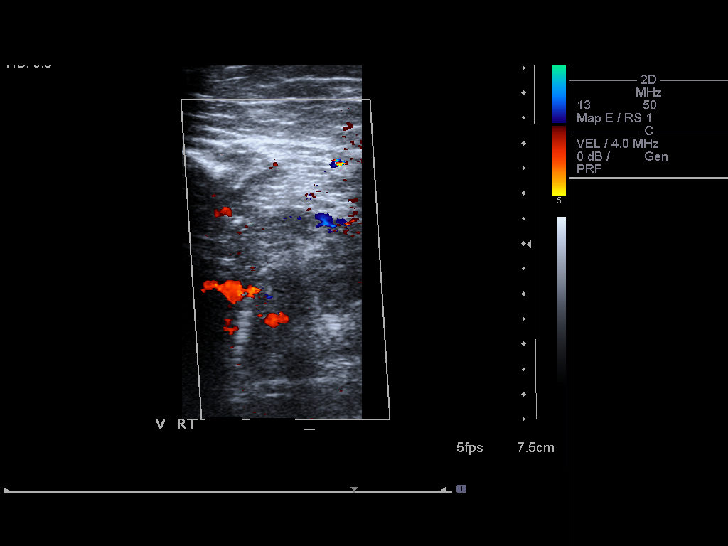
[im 25/25]
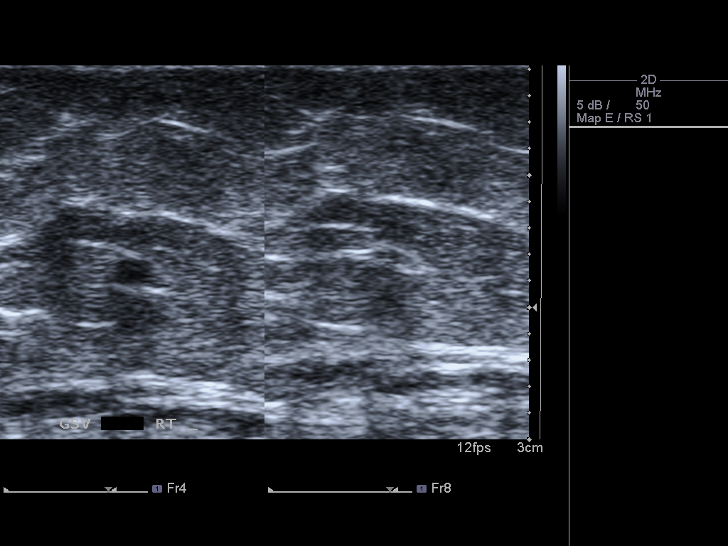

[13 of 24 positions shown; findings below may reference images not displayed]

FINDINGS: Common Femoral Vein: No evidence of thrombus. Normal
compressibility, respiratory phasicity and response to augmentation.
Increased pulsatility of the venous waveform.

Saphenofemoral Junction: No evidence of thrombus. Normal
compressibility and flow on color Doppler imaging.

Profunda Femoral Vein: No evidence of thrombus. Normal
compressibility and flow on color Doppler imaging.

Femoral Vein: No evidence of thrombus. Normal compressibility,
respiratory phasicity and response to augmentation. Increased
pulsatility of the venous waveform.

Popliteal Vein: No evidence of thrombus. Normal compressibility,
respiratory phasicity and response to augmentation.

Calf Veins: No evidence of thrombus. Normal compressibility and flow
on color Doppler imaging.

Superficial Great Saphenous Vein: No evidence of thrombus. Normal
compressibility and flow on color Doppler imaging.

Venous Reflux:  None.

Other Findings:  None.
IMPRESSION: 1. No evidence for deep venous thrombosis.
2. Increased pulsatility of the venous waveform is nonspecific but
suggests elevated right heart pressures. Common differential
considerations include tricuspid regurgitation, right heart strain,
pulmonary arterial hypertension and COPD.

## 2015-11-30 ENCOUNTER — Ambulatory Visit: Payer: Managed Care, Other (non HMO)

## 2015-11-30 DIAGNOSIS — M25551 Pain in right hip: Secondary | ICD-10-CM | POA: Diagnosis not present

## 2015-11-30 DIAGNOSIS — R262 Difficulty in walking, not elsewhere classified: Secondary | ICD-10-CM

## 2015-11-30 DIAGNOSIS — R29898 Other symptoms and signs involving the musculoskeletal system: Secondary | ICD-10-CM

## 2015-11-30 NOTE — Therapy (Signed)
[facility] [address]  [address] [place], [place], [address] Phone: [phone]   Fax:  [phone]  Physical Therapy Treatment  Patient Details  Name: Heather Mueller MRN: [id number] Date of Birth: 10-Jan-1951 Referring Provider: Darcella Cheshire  Encounter Date: 11/30/2015      PT End of Session - 11/30/15 1618    Visit Number 5   Number of Visits 6   Date for PT Re-Evaluation 12/07/15   PT Start Time 1615   PT Stop Time 1701   PT Time Calculation (min) 46 min   Activity Tolerance Patient tolerated treatment well   Behavior During Therapy [facility] for tasks assessed/performed      Past Medical History  Diagnosis Date  . PONV (postoperative nausea and vomiting)   . Hypertension   . Arthritis   . ONGEXBMW(413.2)     Past Surgical History  Procedure Laterality Date  . Abdominal hysterectomy      partial  . Colonoscopy w/ biopsies and polypectomy    . Breast surgery      Lumpectomy x 2 right breast  . Bladder suspension      Hx: x 2  . Total knee arthroplasty Right 02/08/2014    Procedure: RIGHT TOTAL KNEE ARTHROPLASTY;  Surgeon: Dannielle Huh, MD;  [facility];  Service: Orthopedics;  Laterality: Right;    There were no vitals filed for this visit.  Visit Diagnosis:  Right hip pain  Weakness of right hip  Difficulty walking      Subjective Assessment - 11/30/15 1620    Subjective Pt. reports pain at the R hip 0/10 currently however continues to bother her at night generally early 3 am waking her up.     Patient Stated Goals improve strength and decrease pain in R LE   Currently in Pain? No/denies   Pain Score 0-No pain   Multiple Pain Sites No        TODAY'S TREATMENT  TherEx - NuStep lvl 5, 4'  Manual  - MFR throughout R pec/abdominal with emphasis on hip flexors;  - Manual stretching to R Iliopsoas RF in Mod Thomas;  - Supine  Medial/inferior patellar glides grade 3 and 4  - Strumming R ITB and  patellar glides grade 3 and 4 in L side-lying  TherEx - - Hook-lying bridge x 10 with blue band  - Side-Lying Clam Blue TB 15x each - Side-lying Hip ABD 2# 15x each - Fitter BW Lunge 2 Blue 2 x 15 with B Pole A - TRX DL Squat 3 x 10  - Monster Walk Blue TB at ankles 2 x 20' - Side-Stepping Blue TB at ankles 2 x 15'  - B Knee Flexion Machine 25# 15x - B Knee Extension Machine 20# 15x          PT Long Term Goals - 11/09/15 1606    PT LONG TERM GOAL #1   Title pt independent with advanced HEP as necessary by 12/07/15   Status On-going   PT LONG TERM GOAL #2   Title pt reports intermittent pain no greater than 2/10 to R LE by 12/07/15   Status On-going   PT LONG TERM GOAL #3   Title pt able to sleep without limitation by pain by 12/07/15   Status On-going   PT LONG TERM GOAL #4   Title pt able to ambulate over level terrain and up/down inclines without limitation by R LE pain by 12/07/15   Status  On-going   PT LONG TERM GOAL #5   Title R LE MMT 4+/5 or greater by 12/07/15   Status On-going               Plan - 11/30/15 1619    Clinical Impression Statement Pt. pain free at R hip with all therex in today's visit; however reports pain still occasionally at night waking her up.  Pt. tolerated increase in reps in therex well today showing only minimal fatigue.  Pt. continues to progress steadily and is cooperative and motivated.     PT Next Visit Plan ITB and iliopsoas mobility, hip stability training, knee flexion and extension strengthening; progress HEP.   Consulted and Agree with Plan of Care Patient        Problem List Patient Active Problem List   Diagnosis Date Noted  . S/P total knee arthroplasty 02/08/2014    Kermit Balo, PTA 11/30/2015, 5:14 PM  [facility] [address]  [address] [place], [place], [address] Phone: [phone]   Fax:  [phone]  Name: Heather Mueller MRN: [id number] Date of Birth:  Jun 07, 1951

## 2015-12-07 ENCOUNTER — Ambulatory Visit: Payer: Managed Care, Other (non HMO) | Admitting: Physical Therapy

## 2015-12-14 ENCOUNTER — Ambulatory Visit: Payer: Managed Care, Other (non HMO) | Admitting: Physical Therapy

## 2015-12-14 DIAGNOSIS — R29898 Other symptoms and signs involving the musculoskeletal system: Secondary | ICD-10-CM

## 2015-12-14 DIAGNOSIS — M25551 Pain in right hip: Secondary | ICD-10-CM

## 2015-12-14 NOTE — Therapy (Signed)
[facility] [address]  [place], [place], [address] Phone: [phone]   Fax:  [phone]  Physical Therapy Treatment  Patient Details  Name: Heather Mueller MRN: [id number] Date of Birth: December 21, 1950 Referring Provider: Harden Mo  Encounter Date: 12/14/2015      PT End of Session - 12/14/15 1323    Visit Number 6   Number of Visits 6   Date for PT Re-Evaluation 12/07/15   PT Start Time 2878   PT Stop Time 1403   PT Time Calculation (min) 45 min      Past Medical History  Diagnosis Date  . PONV (postoperative nausea and vomiting)   . Hypertension   . Arthritis   . MVEHMCNO(709.6)     Past Surgical History  Procedure Laterality Date  . Abdominal hysterectomy      partial  . Colonoscopy w/ biopsies and polypectomy    . Breast surgery      Lumpectomy x 2 right breast  . Bladder suspension      Hx: x 2  . Total knee arthroplasty Right 02/08/2014    Procedure: RIGHT TOTAL KNEE ARTHROPLASTY;  Surgeon: Vickey Huger, MD;  Location: [facility];  Service: Orthopedics;  Laterality: Right;    There were no vitals filed for this visit.  Visit Diagnosis:  Right hip pain  Weakness of right hip      Subjective Assessment - 12/14/15 1324    Subjective States R hip is typically pain-free and sometimes up to 1/10 by evening.  States L knee seems to have improved with exercise but states still up to 8/10.  States has intermittent difficulty sleeping due to L knee pain.   Currently in Pain? No/denies            [facility] PT Assessment - 12/14/15 0001    Strength   Right Hip Flexion 4+/5   Right Hip Extension 4+/5   Right Hip External Rotation  5/5   Right Hip Internal Rotation 5/5   Right Hip ABduction 4+/5  no pain       TODAY'S TREATMENT TherEx - Side-Lying Clam Black TB 15x each Attempted SL Bridge unable so performed Bridge with march 10x Supine mod thomas hip flexor / RF stretch Knee in chair lunge  hip flexor stretch Knee Flexion Machine 25# 15x Knee Extension Machine 25# 15x Side-Stepping with Green TB 2x20 Standing Hip Extension Green TB at ankles 10x each  R LE MMT assessement          PT Education - 12/14/15 1544    Education provided Yes   Education Details final HEP   Person(s) Educated Patient   Methods Explanation;Demonstration;Handout   Comprehension Returned demonstration;Verbalized understanding             PT Long Term Goals - 12/14/15 1542    PT LONG TERM GOAL #1   Title pt independent with advanced HEP as necessary by 12/07/15   Status Achieved   PT LONG TERM GOAL #2   Title pt reports intermittent pain no greater than 2/10 to R LE by 12/07/15   Status Achieved   PT LONG TERM GOAL #3   Title pt able to sleep without limitation by pain by 12/07/15  not limited by R LE but intermittent issues due to L knee pain   Status Partially Met   PT LONG TERM GOAL #4   Title pt able to ambulate over level terrain and up/down inclines without limitation  by R LE pain by 12/07/15   Status Achieved   PT LONG TERM GOAL #5   Title R LE MMT 4+/5 or greater by 12/07/15   Status Achieved               Plan - 12/14/15 1545    Clinical Impression Statement Goals met, pt pleased with progress regarding R LE.  She states even her L knee has improved some but she know OA in this knee will require TKA eventually.  Pt being discharged from PT due to goals met.   Consulted and Agree with Plan of Care Patient        Problem List Patient Active Problem List   Diagnosis Date Noted  . S/P total knee arthroplasty 02/08/2014    [facility] PT, OCS 12/14/2015, 3:46 PM  [facility] [address]  [place], [place], [address] Phone: [phone]   Fax:  [phone]  Name: Heather Mueller MRN: [id number] Date of Birth: [birth year]   PHYSICAL THERAPY DISCHARGE SUMMARY  Visits from Start of Care:  6  Current functional level related to goals / functional outcomes: Goals met for R LE   Remaining deficits: Slight weakness in low grade pain R hip   Education / Equipment: HEP  Plan: Patient agrees to discharge.  Patient goals were met. Patient is being discharged due to meeting the stated rehab goals.  ?????        Leonette Most PT, OCS 12/14/2015  3:49 PM

## 2017-01-17 ENCOUNTER — Other Ambulatory Visit: Payer: Self-pay | Admitting: Orthopedic Surgery

## 2017-02-13 ENCOUNTER — Encounter (HOSPITAL_COMMUNITY): Payer: Self-pay

## 2017-02-13 ENCOUNTER — Encounter (HOSPITAL_COMMUNITY)
Admission: RE | Admit: 2017-02-13 | Discharge: 2017-02-13 | Disposition: A | Discharge status: Home / Self Care | Payer: Managed Care, Other (non HMO) | Source: Ambulatory Visit | Attending: Orthopedic Surgery | Admitting: Orthopedic Surgery

## 2017-02-13 DIAGNOSIS — R9431 Abnormal electrocardiogram [ECG] [EKG]: Secondary | ICD-10-CM | POA: Insufficient documentation

## 2017-02-13 DIAGNOSIS — M1712 Unilateral primary osteoarthritis, left knee: Secondary | ICD-10-CM | POA: Insufficient documentation

## 2017-02-13 DIAGNOSIS — Z01818 Encounter for other preprocedural examination: Secondary | ICD-10-CM | POA: Insufficient documentation

## 2017-02-13 DIAGNOSIS — Z01812 Encounter for preprocedural laboratory examination: Secondary | ICD-10-CM | POA: Insufficient documentation

## 2017-02-13 HISTORY — DX: Gastro-esophageal reflux disease without esophagitis: K21.9

## 2017-02-13 HISTORY — DX: Cardiac arrhythmia, unspecified: I49.9

## 2017-02-13 LAB — CBC WITH DIFFERENTIAL/PLATELET
Basophils Absolute: 0 10*3/uL (ref 0.0–0.1)
Basophils Relative: 0 %
EOS ABS: 0 10*3/uL (ref 0.0–0.7)
Eosinophils Relative: 0 %
HCT: 41.8 % (ref 36.0–46.0)
HEMOGLOBIN: 14.1 g/dL (ref 12.0–15.0)
LYMPHS ABS: 2.2 10*3/uL (ref 0.7–4.0)
Lymphocytes Relative: 20 %
MCH: 29.9 pg (ref 26.0–34.0)
MCHC: 33.7 g/dL (ref 30.0–36.0)
MCV: 88.7 fL (ref 78.0–100.0)
Monocytes Absolute: 0.6 10*3/uL (ref 0.1–1.0)
Monocytes Relative: 5 %
NEUTROS PCT: 75 %
Neutro Abs: 7.8 10*3/uL — ABNORMAL HIGH (ref 1.7–7.7)
Platelets: 309 10*3/uL (ref 150–400)
RBC: 4.71 MIL/uL (ref 3.87–5.11)
RDW: 13.6 % (ref 11.5–15.5)
WBC: 10.6 10*3/uL — ABNORMAL HIGH (ref 4.0–10.5)

## 2017-02-13 LAB — COMPREHENSIVE METABOLIC PANEL
ALBUMIN: 3.8 g/dL (ref 3.5–5.0)
ALK PHOS: 87 U/L (ref 38–126)
ALT: 28 U/L (ref 14–54)
AST: 24 U/L (ref 15–41)
Anion gap: 10 (ref 5–15)
BUN: 21 mg/dL — ABNORMAL HIGH (ref 6–20)
CALCIUM: 9.2 mg/dL (ref 8.9–10.3)
CO2: 24 mmol/L (ref 22–32)
Chloride: 104 mmol/L (ref 101–111)
Creatinine, Ser: 0.62 mg/dL (ref 0.44–1.00)
GFR calc Af Amer: >60 mL/min (ref >60)
GFR calc non Af Amer: >60 mL/min (ref >60)
GLUCOSE: 86 mg/dL (ref 65–99)
Potassium: 4 mmol/L (ref 3.5–5.1)
SODIUM: 138 mmol/L (ref 135–145)
Total Bilirubin: 0.6 mg/dL (ref 0.3–1.2)
Total Protein: 6.6 g/dL (ref 6.5–8.1)

## 2017-02-13 LAB — SURGICAL PCR SCREEN
MRSA, PCR: NEGATIVE
Staphylococcus aureus: POSITIVE — AB

## 2017-02-13 NOTE — Pre-Procedure Instructions (Addendum)
Heather Mueller  02/13/2017      [facility] - [place], [place] - [address] [place] [address] Phone: [phone] Fax: [phone]  [name] [address] - [place], [place] - [address] [place] [address] Phone: [phone] Fax: [phone]    Your procedure is scheduled on 02/25/17.  Report to [facility] Admitting at 800 A.M.  Call this number if you have problems the morning of surgery:  [phone]   Remember:  Do not eat food or drink liquids after midnight.  Take these medicines the morning of surgery with A SIP OF WATER        Amlodipine(norvasc)  STOP all herbel meds, nsaids (aleve,naproxen,advil,ibuprofen)   prior to surgery starting 02/18/17 including all vitamins/supplements,glucosamine, tumeric, red yeast rice extract   Do not wear jewelry, make-up or nail polish.  Do not wear lotions, powders, or perfumes, or deoderant.  Do not shave 48 hours prior to surgery.  Men may shave face and neck.  Do not bring valuables to the hospital.  [facility] is not responsible for any belongings or valuables.  Contacts, dentures or bridgework may not be worn into surgery.  Leave your suitcase in the car.  After surgery it may be brought to your room.  For patients admitted to the hospital, discharge time will be determined by your treatment team.  Patients discharged the day of surgery will not be allowed to drive home.   Special instructions:   Special Instructions: [facility] - Preparing for Surgery  Before surgery, you can play an important role.  Because skin is not sterile, your skin needs to be as free of germs as possible.  You can reduce the number of germs on you skin by washing with CHG (chlorahexidine gluconate) soap before surgery.  CHG is an antiseptic cleaner which kills germs and bonds with the skin to continue killing germs even after  washing.  Please DO NOT use if you have an allergy to CHG or antibacterial soaps.  If your skin becomes reddened/irritated stop using the CHG and inform your nurse when you arrive at Short Stay.  Do not shave (including legs and underarms) for at least 48 hours prior to the first CHG shower.  You may shave your face.  Please follow these instructions carefully:   1.  Shower with CHG Soap the night before surgery and the morning of Surgery.  2.  If you choose to wash your hair, wash your hair first as usual with your normal shampoo.  3.  After you shampoo, rinse your hair and body thoroughly to remove the Shampoo.  4.  Use CHG as you would any other liquid soap.  You can apply chg directly  to the skin and wash gently with scrungie or a clean washcloth.  5.  Apply the CHG Soap to your body ONLY FROM THE NECK DOWN.  Do not use on open wounds or open sores.  Avoid contact with your eyes ears, mouth and genitals (private parts).  Wash genitals (private parts)       with your normal soap.  6.  Wash thoroughly, paying special attention to the area where your surgery will be performed.  7.  Thoroughly rinse your body with warm water from the neck down.  8.  DO NOT shower/wash with your normal soap after using and rinsing  off the CHG Soap.  9.  Pat yourself dry with a clean towel.            10.  Wear clean pajamas.            11.  Place clean sheets on your bed the night of your first shower and do not sleep with pets.  Day of Surgery  Do not apply any lotions/deodorants the morning of surgery.  Please wear clean clothes to the hospital/surgery center.  Please read over the  fact sheets that you were given.

## 2017-02-22 MED ORDER — CLINDAMYCIN PHOSPHATE 900 MG/50ML IV SOLN
900.0000 mg | INTRAVENOUS | Status: AC
  Administered 2017-02-25: 900 mg via INTRAVENOUS
  Filled 2017-02-22: qty 50

## 2017-02-22 MED ORDER — TRANEXAMIC ACID 1000 MG/10ML IV SOLN
1000.0000 mg | INTRAVENOUS | Status: AC
  Administered 2017-02-25: 1000 mg via INTRAVENOUS
  Filled 2017-02-22: qty 10

## 2017-02-22 MED ORDER — GABAPENTIN 300 MG PO CAPS
300.0000 mg | ORAL_CAPSULE | ORAL | Status: AC
  Administered 2017-02-25: 300 mg via ORAL
  Filled 2017-02-22: qty 1

## 2017-02-22 MED ORDER — DEXAMETHASONE SODIUM PHOSPHATE 10 MG/ML IJ SOLN
8.0000 mg | INTRAMUSCULAR | Status: AC
  Administered 2017-02-25: 8 mg via INTRAVENOUS
  Filled 2017-02-22: qty 1

## 2017-02-22 MED ORDER — BUPIVACAINE LIPOSOME 1.3 % IJ SUSP
20.0000 mL | INTRAMUSCULAR | Status: AC
  Administered 2017-02-25: 20 mL
  Filled 2017-02-22: qty 20

## 2017-02-22 MED ORDER — ACETAMINOPHEN 500 MG PO TABS
1000.0000 mg | ORAL_TABLET | ORAL | Status: AC
  Administered 2017-02-25: 1000 mg via ORAL
  Filled 2017-02-22: qty 2

## 2017-02-24 NOTE — H&P (Signed)
Heather Mueller MRN:  [id number] DOB/SEX:  Mar 30, 1951/female  CHIEF COMPLAINT:  Painful left Knee  HISTORY: Patient is a 66 y.o. female presented with a history of pain in the left knee. Onset of symptoms was gradual starting a few years ago with gradually worsening course since that time. Patient has been treated conservatively with over-the-counter NSAIDs and activity modification. Patient currently rates pain in the knee at 10 out of 10 with activity. There is pain at night.  PAST MEDICAL HISTORY: Patient Active Problem List   Diagnosis Date Noted  . S/P total knee arthroplasty 02/08/2014   Past Medical History:  Diagnosis Date  . Arthritis   . Dysrhythmia    palpitations 15 yrs ago stress done neg results- no tests since.-has occ palpitations  . GERD (gastroesophageal reflux disease)    occ  . Headache(784.0)   . Hypertension   . PONV (postoperative nausea and vomiting)    Past Surgical History:  Procedure Laterality Date  . ABDOMINAL HYSTERECTOMY     partial  . BLADDER SUSPENSION     Hx: x 2  . BREAST SURGERY     Lumpectomy x 3 right breast  . COLONOSCOPY W/ BIOPSIES AND POLYPECTOMY    . TOTAL KNEE ARTHROPLASTY Right 02/08/2014   Procedure: RIGHT TOTAL KNEE ARTHROPLASTY;  Surgeon: Dannielle HuhSteve Lucey, MD;  Location: [facility] OR;  Service: Orthopedics;  Laterality: Right;     MEDICATIONS:   No prescriptions prior to admission.    ALLERGIES:   Allergies  Allergen Reactions  . Nabumetone Other (See Comments)    Headaches and Heartburn   . Bee Venom Swelling    UNSPECIFIED REACTION   . Ciprodex [Ciprofloxacin-Dexamethasone] Rash  . Mango Flavor Rash  . Penicillins Rash  . Tramadol Itching    REVIEW OF SYSTEMS:  A comprehensive review of systems was negative except for: Musculoskeletal: positive for arthralgias and bone pain   FAMILY HISTORY:   Family History  Problem Relation Age of Onset  . Hypertension Mother   . Hypercholesterolemia Mother   . Hypertension  Father   . Heart attack Father   . Cancer - Lung Father   . Hypertension Sister   . Hypertension Brother     SOCIAL HISTORY:   Social History  Substance Use Topics  . Smoking status: Never Smoker  . Smokeless tobacco: Never Used  . Alcohol use Yes     Comment: very rare     EXAMINATION:  Vital signs in last 24 hours:    There were no vitals taken for this visit.  General Appearance:    Alert, cooperative, no distress, appears stated age  Head:    Normocephalic, without obvious abnormality, atraumatic  Eyes:    PERRL, conjunctiva/corneas clear, EOM's intact, fundi    benign, both eyes  Ears:    Normal TM's and external ear canals, both ears  Nose:   Nares normal, septum midline, mucosa normal, no drainage    or sinus tenderness  Throat:   Lips, mucosa, and tongue normal; teeth and gums normal  Neck:   Supple, symmetrical, trachea midline, no adenopathy;    thyroid:  no enlargement/tenderness/nodules; no carotid   bruit or JVD  Back:     Symmetric, no curvature, ROM normal, no CVA tenderness  Lungs:     Clear to auscultation bilaterally, respirations unlabored  Chest Wall:    No tenderness or deformity   Heart:    Regular rate and rhythm, S1 and S2 normal, no murmur, rub  or gallop  Breast Exam:    No tenderness, masses, or nipple abnormality  Abdomen:     Soft, non-tender, bowel sounds active all four quadrants,    no masses, no organomegaly  Genitalia:    Normal female without lesion, discharge or tenderness  Rectal:    Normal tone, no masses or tenderness;   guaiac negative stool  Extremities:   Extremities normal, atraumatic, no cyanosis or edema  Pulses:   2+ and symmetric all extremities  Skin:   Skin color, texture, turgor normal, no rashes or lesions  Lymph nodes:   Cervical, supraclavicular, and axillary nodes normal  Neurologic:   CNII-XII intact, normal strength, sensation and reflexes    throughout    Musculoskeletal:  ROM 0-120, Ligaments intact,   Imaging Review Plain radiographs demonstrate severe degenerative joint disease of the left knee. The overall alignment is neutral. The bone quality appears to be good for age and reported activity level.  Assessment/Plan: Primary osteoarthritis, left knee   The patient history, physical examination and imaging studies are consistent with advanced degenerative joint disease of the left knee. The patient has failed conservative treatment.  The clearance notes were reviewed.  After discussion with the patient it was felt that Total Knee Replacement was indicated. The procedure,  risks, and benefits of total knee arthroplasty were presented and reviewed. The risks including but not limited to aseptic loosening, infection, blood clots, vascular injury, stiffness, patella tracking problems complications among others were discussed. The patient acknowledged the explanation, agreed to proceed with the plan.  Guy Sandifer 02/24/2017, 9:30 PM

## 2017-02-25 ENCOUNTER — Ambulatory Visit (HOSPITAL_COMMUNITY): Payer: Medicare Other | Admitting: Emergency Medicine

## 2017-02-25 ENCOUNTER — Encounter (HOSPITAL_COMMUNITY): Payer: Self-pay | Admitting: *Deleted

## 2017-02-25 ENCOUNTER — Ambulatory Visit (HOSPITAL_COMMUNITY): Payer: Medicare Other | Admitting: Certified Registered"

## 2017-02-25 ENCOUNTER — Observation Stay (HOSPITAL_COMMUNITY)
Admission: RE | Admit: 2017-02-25 | Discharge: 2017-02-26 | Disposition: A | Discharge status: Skilled Nursing Facility | Payer: Medicare Other | Source: Ambulatory Visit | Attending: Orthopedic Surgery | Admitting: Orthopedic Surgery

## 2017-02-25 ENCOUNTER — Encounter (HOSPITAL_COMMUNITY)
Admission: RE | Disposition: A | Discharge status: Skilled Nursing Facility | Payer: Self-pay | Source: Ambulatory Visit | Attending: Orthopedic Surgery

## 2017-02-25 DIAGNOSIS — Z91030 Bee allergy status: Secondary | ICD-10-CM | POA: Diagnosis not present

## 2017-02-25 DIAGNOSIS — Z880 Allergy status to penicillin: Secondary | ICD-10-CM | POA: Insufficient documentation

## 2017-02-25 DIAGNOSIS — M1712 Unilateral primary osteoarthritis, left knee: Principal | ICD-10-CM | POA: Insufficient documentation

## 2017-02-25 DIAGNOSIS — Z96651 Presence of right artificial knee joint: Secondary | ICD-10-CM | POA: Insufficient documentation

## 2017-02-25 DIAGNOSIS — Z7982 Long term (current) use of aspirin: Secondary | ICD-10-CM | POA: Diagnosis not present

## 2017-02-25 DIAGNOSIS — Z96659 Presence of unspecified artificial knee joint: Secondary | ICD-10-CM

## 2017-02-25 DIAGNOSIS — I10 Essential (primary) hypertension: Secondary | ICD-10-CM | POA: Insufficient documentation

## 2017-02-25 DIAGNOSIS — K219 Gastro-esophageal reflux disease without esophagitis: Secondary | ICD-10-CM | POA: Insufficient documentation

## 2017-02-25 HISTORY — PX: TOTAL KNEE ARTHROPLASTY: SHX125

## 2017-02-25 SURGERY — ARTHROPLASTY, KNEE, TOTAL
Anesthesia: Spinal | Site: Knee | Laterality: Left

## 2017-02-25 MED ORDER — SODIUM CHLORIDE 0.9 % IR SOLN
Status: DC | PRN
  Administered 2017-02-25: 3000 mL

## 2017-02-25 MED ORDER — PHENYLEPHRINE HCL 10 MG/ML IJ SOLN
INTRAMUSCULAR | Status: DC | PRN
  Administered 2017-02-25: 30 ug/min via INTRAVENOUS

## 2017-02-25 MED ORDER — PHENYLEPHRINE 40 MCG/ML (10ML) SYRINGE FOR IV PUSH (FOR BLOOD PRESSURE SUPPORT)
PREFILLED_SYRINGE | INTRAVENOUS | Status: AC
  Filled 2017-02-25: qty 10

## 2017-02-25 MED ORDER — ACETAMINOPHEN 650 MG RE SUPP: 650.0000 mg | Freq: Four times a day (QID) | RECTAL | Status: DC | PRN

## 2017-02-25 MED ORDER — ACETAMINOPHEN 325 MG PO TABS: 650.0000 mg | ORAL_TABLET | Freq: Four times a day (QID) | ORAL | Status: DC | PRN

## 2017-02-25 MED ORDER — LIDOCAINE HCL (CARDIAC) 20 MG/ML IV SOLN
INTRAVENOUS | Status: DC | PRN
  Administered 2017-02-25: 100 mg via INTRATRACHEAL

## 2017-02-25 MED ORDER — PHENOL 1.4 % MT LIQD: 1.0000 | OROMUCOSAL | Status: DC | PRN

## 2017-02-25 MED ORDER — METOCLOPRAMIDE HCL 5 MG/ML IJ SOLN: 5.0000 mg | Freq: Three times a day (TID) | INTRAMUSCULAR | Status: DC | PRN

## 2017-02-25 MED ORDER — BUPIVACAINE-EPINEPHRINE (PF) 0.5% -1:200000 IJ SOLN
INTRAMUSCULAR | Status: AC
  Filled 2017-02-25: qty 30

## 2017-02-25 MED ORDER — OXYCODONE HCL 5 MG PO TABS
5.0000 mg | ORAL_TABLET | ORAL | Status: DC | PRN
  Administered 2017-02-25: 10 mg via ORAL
  Filled 2017-02-25: qty 2

## 2017-02-25 MED ORDER — ONDANSETRON HCL 4 MG/2ML IJ SOLN
4.0000 mg | Freq: Four times a day (QID) | INTRAMUSCULAR | Status: DC | PRN
  Filled 2017-02-25: qty 2

## 2017-02-25 MED ORDER — FENTANYL CITRATE (PF) 100 MCG/2ML IJ SOLN
INTRAMUSCULAR | Status: DC | PRN
  Administered 2017-02-25 (×2): 50 ug via INTRAVENOUS

## 2017-02-25 MED ORDER — PROPOFOL 500 MG/50ML IV EMUL
INTRAVENOUS | Status: DC | PRN
  Administered 2017-02-25: 75 ug/kg/min via INTRAVENOUS

## 2017-02-25 MED ORDER — HYDROCODONE-ACETAMINOPHEN 7.5-325 MG PO TABS
1.0000 | ORAL_TABLET | Freq: Four times a day (QID) | ORAL | Status: DC
  Administered 2017-02-25 – 2017-02-26 (×5): 1 via ORAL
  Filled 2017-02-25 (×5): qty 1

## 2017-02-25 MED ORDER — PROPOFOL 10 MG/ML IV BOLUS
INTRAVENOUS | Status: DC | PRN
  Administered 2017-02-25 (×2): 30 mg via INTRAVENOUS

## 2017-02-25 MED ORDER — GABAPENTIN 300 MG PO CAPS
300.0000 mg | ORAL_CAPSULE | Freq: Three times a day (TID) | ORAL | Status: DC
  Administered 2017-02-25 – 2017-02-26 (×3): 300 mg via ORAL
  Filled 2017-02-25 (×3): qty 1

## 2017-02-25 MED ORDER — CHLORHEXIDINE GLUCONATE 4 % EX LIQD: 60.0000 mL | Freq: Once | CUTANEOUS | Status: DC

## 2017-02-25 MED ORDER — DIPHENHYDRAMINE HCL 12.5 MG/5ML PO ELIX: 12.5000 mg | ORAL_SOLUTION | ORAL | Status: DC | PRN

## 2017-02-25 MED ORDER — FLEET ENEMA 7-19 GM/118ML RE ENEM: 1.0000 | ENEMA | Freq: Once | RECTAL | Status: DC | PRN

## 2017-02-25 MED ORDER — MIDAZOLAM HCL 2 MG/2ML IJ SOLN
1.0000 mg | Freq: Once | INTRAMUSCULAR | Status: AC
  Administered 2017-02-25: 1 mg via INTRAVENOUS

## 2017-02-25 MED ORDER — ACETAMINOPHEN 500 MG PO TABS
1000.0000 mg | ORAL_TABLET | Freq: Four times a day (QID) | ORAL | Status: AC
  Administered 2017-02-25 (×2): 1000 mg via ORAL
  Filled 2017-02-25 (×3): qty 2

## 2017-02-25 MED ORDER — DOCUSATE SODIUM 100 MG PO CAPS
100.0000 mg | ORAL_CAPSULE | Freq: Two times a day (BID) | ORAL | Status: DC
  Administered 2017-02-25 – 2017-02-26 (×3): 100 mg via ORAL
  Filled 2017-02-25 (×3): qty 1

## 2017-02-25 MED ORDER — ASPIRIN EC 325 MG PO TBEC
325.0000 mg | DELAYED_RELEASE_TABLET | Freq: Two times a day (BID) | ORAL | Status: DC
  Administered 2017-02-25 – 2017-02-26 (×3): 325 mg via ORAL
  Filled 2017-02-25 (×3): qty 1

## 2017-02-25 MED ORDER — METHOCARBAMOL 500 MG PO TABS
500.0000 mg | ORAL_TABLET | Freq: Four times a day (QID) | ORAL | Status: DC | PRN
  Administered 2017-02-25 – 2017-02-26 (×3): 500 mg via ORAL
  Filled 2017-02-25 (×3): qty 1

## 2017-02-25 MED ORDER — DEXAMETHASONE SODIUM PHOSPHATE 10 MG/ML IJ SOLN
10.0000 mg | Freq: Once | INTRAMUSCULAR | Status: AC
  Administered 2017-02-26: 10 mg via INTRAVENOUS
  Filled 2017-02-25: qty 1

## 2017-02-25 MED ORDER — TRANEXAMIC ACID 1000 MG/10ML IV SOLN
1000.0000 mg | Freq: Once | INTRAVENOUS | Status: AC
  Administered 2017-02-25: 1000 mg via INTRAVENOUS
  Filled 2017-02-25: qty 10

## 2017-02-25 MED ORDER — LACTATED RINGERS IV SOLN
INTRAVENOUS | Status: DC
  Administered 2017-02-25: 50 mL/h via INTRAVENOUS
  Administered 2017-02-25: 11:00:00 via INTRAVENOUS

## 2017-02-25 MED ORDER — SODIUM CHLORIDE 0.9 % IJ SOLN
INTRAMUSCULAR | Status: DC | PRN
  Administered 2017-02-25: 20 mL

## 2017-02-25 MED ORDER — ZOLPIDEM TARTRATE 5 MG PO TABS: 5.0000 mg | ORAL_TABLET | Freq: Every evening | ORAL | Status: DC | PRN

## 2017-02-25 MED ORDER — BUPIVACAINE-EPINEPHRINE (PF) 0.5% -1:200000 IJ SOLN
INTRAMUSCULAR | Status: DC | PRN
  Administered 2017-02-25: 25 mL via PERINEURAL

## 2017-02-25 MED ORDER — MENTHOL 3 MG MT LOZG: 1.0000 | LOZENGE | OROMUCOSAL | Status: DC | PRN

## 2017-02-25 MED ORDER — PHENYLEPHRINE HCL 10 MG/ML IJ SOLN
INTRAMUSCULAR | Status: DC | PRN
  Administered 2017-02-25 (×2): 80 ug via INTRAVENOUS
  Administered 2017-02-25: 120 ug via INTRAVENOUS

## 2017-02-25 MED ORDER — MIDAZOLAM HCL 2 MG/2ML IJ SOLN
INTRAMUSCULAR | Status: AC
  Filled 2017-02-25: qty 2

## 2017-02-25 MED ORDER — HYDROMORPHONE HCL 1 MG/ML IJ SOLN: 1.0000 mg | INTRAMUSCULAR | Status: DC | PRN

## 2017-02-25 MED ORDER — METOCLOPRAMIDE HCL 5 MG PO TABS: 5.0000 mg | ORAL_TABLET | Freq: Three times a day (TID) | ORAL | Status: DC | PRN

## 2017-02-25 MED ORDER — FENTANYL CITRATE (PF) 250 MCG/5ML IJ SOLN
INTRAMUSCULAR | Status: AC
  Filled 2017-02-25: qty 5

## 2017-02-25 MED ORDER — BISACODYL 5 MG PO TBEC: 5.0000 mg | DELAYED_RELEASE_TABLET | Freq: Every day | ORAL | Status: DC | PRN

## 2017-02-25 MED ORDER — BUPIVACAINE HCL (PF) 0.75 % IJ SOLN
INTRAMUSCULAR | Status: DC | PRN
  Administered 2017-02-25: 1.8 mL via INTRATHECAL

## 2017-02-25 MED ORDER — BUPIVACAINE-EPINEPHRINE 0.5% -1:200000 IJ SOLN
INTRAMUSCULAR | Status: DC | PRN
  Administered 2017-02-25: 30 mL

## 2017-02-25 MED ORDER — LIDOCAINE 2% (20 MG/ML) 5 ML SYRINGE
INTRAMUSCULAR | Status: AC
  Filled 2017-02-25: qty 5

## 2017-02-25 MED ORDER — LOSARTAN POTASSIUM 50 MG PO TABS
50.0000 mg | ORAL_TABLET | Freq: Two times a day (BID) | ORAL | Status: DC
  Administered 2017-02-25 – 2017-02-26 (×3): 50 mg via ORAL
  Filled 2017-02-25 (×3): qty 1

## 2017-02-25 MED ORDER — ONDANSETRON HCL 4 MG PO TABS: 4.0000 mg | ORAL_TABLET | Freq: Four times a day (QID) | ORAL | Status: DC | PRN

## 2017-02-25 MED ORDER — AMLODIPINE BESYLATE 5 MG PO TABS
5.0000 mg | ORAL_TABLET | Freq: Every day | ORAL | Status: DC
  Administered 2017-02-26: 5 mg via ORAL
  Filled 2017-02-25: qty 1

## 2017-02-25 MED ORDER — FENTANYL CITRATE (PF) 100 MCG/2ML IJ SOLN
50.0000 ug | Freq: Once | INTRAMUSCULAR | Status: AC
  Administered 2017-02-25: 50 ug via INTRAVENOUS

## 2017-02-25 MED ORDER — ALUM & MAG HYDROXIDE-SIMETH 200-200-20 MG/5ML PO SUSP: 30.0000 mL | ORAL | Status: DC | PRN

## 2017-02-25 MED ORDER — SENNOSIDES-DOCUSATE SODIUM 8.6-50 MG PO TABS: 1.0000 | ORAL_TABLET | Freq: Every evening | ORAL | Status: DC | PRN

## 2017-02-25 MED ORDER — CLINDAMYCIN PHOSPHATE 600 MG/50ML IV SOLN
600.0000 mg | Freq: Four times a day (QID) | INTRAVENOUS | Status: AC
  Administered 2017-02-25 (×2): 600 mg via INTRAVENOUS
  Filled 2017-02-25 (×2): qty 50

## 2017-02-25 MED ORDER — FENTANYL CITRATE (PF) 100 MCG/2ML IJ SOLN
INTRAMUSCULAR | Status: AC
  Filled 2017-02-25: qty 2

## 2017-02-25 MED ORDER — METHOCARBAMOL 1000 MG/10ML IJ SOLN
500.0000 mg | Freq: Four times a day (QID) | INTRAMUSCULAR | Status: DC | PRN
  Filled 2017-02-25: qty 5

## 2017-02-25 SURGICAL SUPPLY — 63 items
BANDAGE ACE 6X5 VEL STRL LF (GAUZE/BANDAGES/DRESSINGS) ×3 IMPLANT
BANDAGE ESMARK 6X9 LF (GAUZE/BANDAGES/DRESSINGS) ×1 IMPLANT
BLADE SAGITTAL 13X1.27X60 (BLADE) ×2 IMPLANT
BLADE SAGITTAL 13X1.27X60MM (BLADE) ×1
BLADE SAW SGTL 83.5X18.5 (BLADE) ×3 IMPLANT
BLADE SURG 10 STRL SS (BLADE) ×3 IMPLANT
BNDG ESMARK 6X9 LF (GAUZE/BANDAGES/DRESSINGS) ×3
BOWL SMART MIX CTS (DISPOSABLE) ×3 IMPLANT
CAPT KNEE TOTAL 3 ×3 IMPLANT
CEMENT BONE SIMPLEX SPEEDSET (Cement) ×6 IMPLANT
CLOSURE STERI-STRIP 1/2X4 (GAUZE/BANDAGES/DRESSINGS) ×1
CLOSURE WOUND 1/2 X4 (GAUZE/BANDAGES/DRESSINGS) ×1
CLSR STERI-STRIP ANTIMIC 1/2X4 (GAUZE/BANDAGES/DRESSINGS) ×2 IMPLANT
COVER SURGICAL LIGHT HANDLE (MISCELLANEOUS) ×3 IMPLANT
CUFF TOURNIQUET SINGLE 34IN LL (TOURNIQUET CUFF) ×3 IMPLANT
DRAPE EXTREMITY T 121X128X90 (DRAPE) ×3 IMPLANT
DRAPE HALF SHEET 40X57 (DRAPES) ×3 IMPLANT
DRAPE INCISE IOBAN 66X45 STRL (DRAPES) ×6 IMPLANT
DRAPE U-SHAPE 47X51 STRL (DRAPES) ×3 IMPLANT
DRSG AQUACEL AG ADV 3.5X10 (GAUZE/BANDAGES/DRESSINGS) ×3 IMPLANT
DURAPREP 26ML APPLICATOR (WOUND CARE) ×6 IMPLANT
ELECT REM PT RETURN 9FT ADLT (ELECTROSURGICAL) ×3
ELECTRODE REM PT RTRN 9FT ADLT (ELECTROSURGICAL) ×1 IMPLANT
FILTER STRAW FLUID ASPIR (MISCELLANEOUS) IMPLANT
GLOVE BIOGEL M 7.0 STRL (GLOVE) IMPLANT
GLOVE BIOGEL PI IND STRL 7.5 (GLOVE) IMPLANT
GLOVE BIOGEL PI IND STRL 8.5 (GLOVE) ×2 IMPLANT
GLOVE BIOGEL PI INDICATOR 7.5 (GLOVE)
GLOVE BIOGEL PI INDICATOR 8.5 (GLOVE) ×4
GLOVE SURG ORTHO 8.0 STRL STRW (GLOVE) ×6 IMPLANT
GOWN STRL REUS W/ TWL LRG LVL3 (GOWN DISPOSABLE) ×2 IMPLANT
GOWN STRL REUS W/ TWL XL LVL3 (GOWN DISPOSABLE) ×2 IMPLANT
GOWN STRL REUS W/TWL 2XL LVL3 (GOWN DISPOSABLE) ×3 IMPLANT
GOWN STRL REUS W/TWL LRG LVL3 (GOWN DISPOSABLE) ×4
GOWN STRL REUS W/TWL XL LVL3 (GOWN DISPOSABLE) ×4
HANDPIECE INTERPULSE COAX TIP (DISPOSABLE) ×2
HOOD PEEL AWAY FACE SHEILD DIS (HOOD) ×9 IMPLANT
KIT BASIN OR (CUSTOM PROCEDURE TRAY) ×3 IMPLANT
KIT ROOM TURNOVER OR (KITS) ×3 IMPLANT
KNEE CAPITATED TOTAL 3 ×1 IMPLANT
MANIFOLD NEPTUNE II (INSTRUMENTS) ×3 IMPLANT
NEEDLE 18GX1X1/2 (RX/OR ONLY) (NEEDLE) IMPLANT
NEEDLE 22X1 1/2 (OR ONLY) (NEEDLE) ×6 IMPLANT
NS IRRIG 1000ML POUR BTL (IV SOLUTION) ×3 IMPLANT
PACK TOTAL JOINT (CUSTOM PROCEDURE TRAY) ×3 IMPLANT
PAD ARMBOARD 7.5X6 YLW CONV (MISCELLANEOUS) ×6 IMPLANT
SET HNDPC FAN SPRY TIP SCT (DISPOSABLE) ×1 IMPLANT
STRIP CLOSURE SKIN 1/2X4 (GAUZE/BANDAGES/DRESSINGS) ×2 IMPLANT
SUCTION FRAZIER HANDLE 10FR (MISCELLANEOUS)
SUCTION TUBE FRAZIER 10FR DISP (MISCELLANEOUS) IMPLANT
SUT BONE WAX W31G (SUTURE) ×3 IMPLANT
SUT MNCRL AB 3-0 PS2 18 (SUTURE) ×3 IMPLANT
SUT VIC AB 0 CTB1 27 (SUTURE) ×6 IMPLANT
SUT VIC AB 1 CT1 27 (SUTURE) ×4
SUT VIC AB 1 CT1 27XBRD ANBCTR (SUTURE) ×2 IMPLANT
SUT VIC AB 2-0 CT1 27 (SUTURE) ×4
SUT VIC AB 2-0 CT1 TAPERPNT 27 (SUTURE) ×2 IMPLANT
SYR 20CC LL (SYRINGE) ×6 IMPLANT
SYR TB 1ML LUER SLIP (SYRINGE) IMPLANT
TOWEL OR 17X24 6PK STRL BLUE (TOWEL DISPOSABLE) ×3 IMPLANT
TOWEL OR 17X26 10 PK STRL BLUE (TOWEL DISPOSABLE) ×3 IMPLANT
TRAY CATH 16FR W/PLASTIC CATH (SET/KITS/TRAYS/PACK) IMPLANT
WRAP KNEE MAXI GEL POST OP (GAUZE/BANDAGES/DRESSINGS) ×3 IMPLANT

## 2017-02-25 NOTE — Progress Notes (Signed)
Orthopedic Tech Progress Note Patient Details:  Heather Mueller September 18, 1951 [id number] On cpm at 1825 Patient ID: Heather Mueller, female   DOB: September 18, 1951, 66 y.o.   MRN: [id number]   Heather Mueller, Heather Mueller 02/25/2017, 6:29 PM

## 2017-02-25 NOTE — Transfer of Care (Signed)
Immediate Anesthesia Transfer of Care Note  Patient: Heather Mueller  Procedure(s) Performed: Procedure(s): LEFT TOTAL KNEE ARTHROPLASTY (Left)  Patient Location: PACU  Anesthesia Type:Spinal and MAC combined with regional for post-op pain  Level of Consciousness: sedated and patient cooperative  Airway & Oxygen Therapy: Patient Spontanous Breathing and Patient connected to nasal cannula oxygen  Post-op Assessment: Report given to RN and Post -op Vital signs reviewed and stable  Post vital signs: Reviewed and stable  Last Vitals:  Vitals:   02/25/17 1000 02/25/17 1003  BP: 127/61 138/63  Pulse: 79 73  Resp: 14 11  Temp:      Last Pain:  Vitals:   02/25/17 0802  TempSrc: Oral      Patients Stated Pain Goal: 7 (62/03/55 9741)  Complications: No apparent anesthesia complications

## 2017-02-25 NOTE — Progress Notes (Signed)
Orthopedic Tech Progress Note Patient Details:  Glendon AxeDawn Nelson Diiorio 1950/12/03 [id number]  CPM Left Knee CPM Left Knee: On Left Knee Flexion (Degrees): 90 Left Knee Extension (Degrees): 0   Lowella Kindley 02/25/2017, 12:45 PM Trapeze bar patient helper not applied because pt's weight exceeds durability of frame; RN notified

## 2017-02-25 NOTE — Anesthesia Procedure Notes (Signed)
Procedure Name: MAC Date/Time: 02/25/2017 10:51 AM Performed by: Lance Coon Pre-anesthesia Checklist: Patient identified, Emergency Drugs available, Suction available, Patient being monitored and Timeout performed Patient Re-evaluated:Patient Re-evaluated prior to inductionOxygen Delivery Method: Circle system utilized

## 2017-02-25 NOTE — Anesthesia Procedure Notes (Signed)
Procedure Name: MAC Date/Time: 02/25/2017 4:36 PM Performed by: Lance Coon Pre-anesthesia Checklist: Patient identified, Emergency Drugs available, Suction available, Patient being monitored and Timeout performed Patient Re-evaluated:Patient Re-evaluated prior to inductionOxygen Delivery Method: Nasal cannula

## 2017-02-25 NOTE — Evaluation (Signed)
Physical Therapy Evaluation Patient Details Name: Heather Mueller MRN: [id number] DOB: Sep 22, 1951 Today's Date: 02/25/2017   History of Present Illness  Pt is a 66 y/o female s/p elective L TKA secondary to L knee OA. PMH includes R TKA, HTN, and dysrhythmia.   Clinical Impression  Pt s/p elective surgery above with deficits below. PTA, pt was independent with functional mobility. Upon evaluation, pt limited by post op pain and weakness, as well as nausea with mobility. Required min guard to min A for functional mobility. Pt reporting husband is available to assist as needed upon d/c home. Recommendations below. Will continue to follow to maximize safety and independence with functional mobility.     Follow Up Recommendations Home health PT;Supervision/Assistance - 24 hour    Equipment Recommendations  None recommended by PT    Recommendations for Other Services       Precautions / Restrictions Precautions Precautions: Knee Precaution Booklet Issued: Yes (comment) Precaution Comments: Reviewed supine ther ex.  Restrictions Weight Bearing Restrictions: Yes LLE Weight Bearing: Weight bearing as tolerated      Mobility  Bed Mobility Overal bed mobility: Needs Assistance Bed Mobility: Supine to Sit     Supine to sit: Min assist     General bed mobility comments: Min A for LLE management.   Transfers Overall transfer level: Needs assistance Equipment used: Rolling walker (2 wheeled) Transfers: Sit to/from Stand Sit to Stand: Min guard         General transfer comment: Min guard for steadying. Verbal cues for appropriate hand position and foot position during transfer.   Ambulation/Gait Ambulation/Gait assistance: Min guard Ambulation Distance (Feet): 25 Feet Assistive device: Rolling walker (2 wheeled) Gait Pattern/deviations: Step-to pattern;Decreased step length - left;Decreased weight shift to left;Antalgic Gait velocity: Decreased Gait velocity  interpretation: Below normal speed for age/gender General Gait Details: Slow, antalgic gait secondary to post op pain and weakness. Verbal cues for sequencing using RW and upright posture. Pt reporting nausea during ambulation, therefore, ambulation distance limited this session.   Stairs            Wheelchair Mobility    Modified Rankin (Stroke Patients Only)       Balance Overall balance assessment: Needs assistance Sitting-balance support: No upper extremity supported;Feet supported Sitting balance-Leahy Scale: Good     Standing balance support: Bilateral upper extremity supported;During functional activity Standing balance-Leahy Scale: Poor Standing balance comment: Reliant on RW for support                             Pertinent Vitals/Pain Pain Assessment: 0-10 Pain Score: 5  Pain Location: L knee Pain Descriptors / Indicators: Aching;Operative site guarding Pain Intervention(s): Limited activity within patient's tolerance;Monitored during session;Repositioned    Home Living Family/patient expects to be discharged to:: Private residence Living Arrangements: Spouse/significant other Available Help at Discharge: Family;Available 24 hours/day Type of Home: House Home Access: Stairs to enter Entrance Stairs-Rails: Right Entrance Stairs-Number of Steps: 3 Home Layout: One level Home Equipment: Bedside commode;Walker - 2 wheels;Other (comment);Cane - quad (CPM )      Prior Function Level of Independence: Independent               Hand Dominance   Dominant Hand: Left    Extremity/Trunk Assessment   Upper Extremity Assessment Upper Extremity Assessment: Defer to OT evaluation    Lower Extremity Assessment Lower Extremity Assessment: LLE deficits/detail LLE Deficits / Details: Sensory in tact.  Deficits consistent with post op pain and weakness.     Cervical / Trunk Assessment Cervical / Trunk Assessment: Kyphotic  Communication    Communication: No difficulties  Cognition Arousal/Alertness: Awake/alert Behavior During Therapy: WFL for tasks assessed/performed Overall Cognitive Status: Within Functional Limits for tasks assessed                                        General Comments General comments (skin integrity, edema, etc.): Pt's husband present throughout session. RN notified of nausea.     Exercises Total Joint Exercises Ankle Circles/Pumps: AROM;Both;10 reps;Supine Quad Sets: AROM;Left;10 reps;Supine Towel Squeeze: AROM;Both;10 reps;Supine Straight Leg Raises: AROM;Left;10 reps;Supine   Assessment/Plan    PT Assessment Patient needs continued PT services  PT Problem List Decreased strength;Decreased range of motion;Decreased balance;Decreased activity tolerance;Decreased mobility;Decreased knowledge of use of DME;Decreased knowledge of precautions;Pain       PT Treatment Interventions DME instruction;Gait training;Stair training;Functional mobility training;Balance training;Therapeutic exercise;Therapeutic activities;Neuromuscular re-education;Patient/family education    PT Goals (Current goals can be found in the Care Plan section)  Acute Rehab PT Goals Patient Stated Goal: to return home  PT Goal Formulation: With patient Time For Goal Achievement: 03/04/17 Potential to Achieve Goals: Good    Frequency 7X/week   Barriers to discharge        Co-evaluation               AM-PAC PT "6 Clicks" Daily Activity  Outcome Measure Difficulty turning over in bed (including adjusting bedclothes, sheets and blankets)?: A Little Difficulty moving from lying on back to sitting on the side of the bed? : Total Difficulty sitting down on and standing up from a chair with arms (e.g., wheelchair, bedside commode, etc,.)?: Total Help needed moving to and from a bed to chair (including a wheelchair)?: A Little Help needed walking in hospital room?: A Little Help needed climbing 3-5  steps with a railing? : A Little 6 Click Score: 14    End of Session Equipment Utilized During Treatment: Gait belt Activity Tolerance: Treatment limited secondary to medical complications (Comment) (nausea) Patient left: in chair;with call bell/phone within reach;with family/visitor present Nurse Communication: Mobility status;Other (comment) (nausea) PT Visit Diagnosis: Other abnormalities of gait and mobility (R26.89);Pain Pain - Right/Left: Left Pain - part of body: Knee    Time: 1556-1630 PT Time Calculation (min) (ACUTE ONLY): 34 min   Charges:   PT Evaluation $PT Eval Low Complexity: 1 Procedure PT Treatments $Gait Training: 8-22 mins   PT G Codes:   PT G-Codes **NOT FOR INPATIENT CLASS** Functional Assessment Tool Used: AM-PAC 6 Clicks Basic Mobility;Clinical judgement Functional Limitation: Mobility: Walking and moving around Mobility: Walking and Moving Around Current Status (Z3664): At least 40 percent but less than 60 percent impaired, limited or restricted Mobility: Walking and Moving Around Goal Status [phone]): At least 1 percent but less than 20 percent impaired, limited or restricted    Margot Chimes, PT, DPT  Acute Rehabilitation Services  Pager: [phone]   Melvyn Novas 02/25/2017, 5:37 PM

## 2017-02-25 NOTE — Anesthesia Procedure Notes (Signed)
Spinal  Patient location during procedure: OR Start time: 02/25/2017 10:22 AM End time: 02/25/2017 10:30 AM Staffing Anesthesiologist: Sharee HolsterMASSAGEE, Miarose Lippert Performed: anesthesiologist  Preanesthetic Checklist Completed: patient identified, site marked, surgical consent, pre-op evaluation, timeout performed, IV checked, risks and benefits discussed and monitors and equipment checked Spinal Block Patient position: sitting Prep: ChloraPrep Patient monitoring: heart rate, cardiac monitor, continuous pulse ox and blood pressure Approach: right paramedian Location: L3-4 Injection technique: single-shot Needle Needle type: Quincke  Needle gauge: 25 G Needle insertion depth: 4 cm Assessment Sensory level: T6

## 2017-02-25 NOTE — Anesthesia Preprocedure Evaluation (Signed)
Anesthesia Evaluation  Patient identified by MRN, date of birth, ID band Patient awake    History of Anesthesia Complications (+) PONV and history of anesthetic complications  Airway Mallampati: II       Dental   Pulmonary    breath sounds clear to auscultation       Cardiovascular hypertension, + dysrhythmias  Rhythm:Regular Rate:Normal     Neuro/Psych  Headaches,    GI/Hepatic GERD  ,  Endo/Other    Renal/GU      Musculoskeletal  (+) Arthritis ,   Abdominal   Peds  Hematology   Anesthesia Other Findings   Reproductive/Obstetrics                             Anesthesia Physical Anesthesia Plan  ASA: II  Anesthesia Plan: Spinal   Post-op Pain Management:  Regional for Post-op pain   Induction: Intravenous  Airway Management Planned: Natural Airway and Simple Face Mask  Additional Equipment:   Intra-op Plan:   Post-operative Plan:   Informed Consent: I have reviewed the patients History and Physical, chart, labs and discussed the procedure including the risks, benefits and alternatives for the proposed anesthesia with the patient or authorized representative who has indicated his/her understanding and acceptance.     Plan Discussed with: CRNA  Anesthesia Plan Comments:         Anesthesia Quick Evaluation

## 2017-02-25 NOTE — Anesthesia Procedure Notes (Signed)
Anesthesia Regional Block: Adductor canal block   Pre-Anesthetic Checklist: ,, timeout performed, Correct Patient, Correct Site, Correct Laterality, Correct Procedure, Correct Position, site marked, Risks and benefits discussed,  Surgical consent,  Pre-op evaluation,  At surgeon's request and post-op pain management  Laterality: Left and Lower  Prep: chloraprep       Needles:   Needle Type: Echogenic Stimulator Needle     Needle Length: 9cm  Needle Gauge: 21   Needle insertion depth: 6 cm   Additional Needles:   Procedures: ultrasound guided,,,,,,,,  Narrative:  Start time: 02/25/2017 9:35 AM End time: 02/25/2017 9:50 AM Injection made incrementally with aspirations every 5 mL.  Performed by: Personally  Anesthesiologist: Kaizlee Carlino

## 2017-02-26 ENCOUNTER — Encounter (HOSPITAL_COMMUNITY): Payer: Self-pay | Admitting: Orthopedic Surgery

## 2017-02-26 DIAGNOSIS — M1712 Unilateral primary osteoarthritis, left knee: Secondary | ICD-10-CM | POA: Diagnosis not present

## 2017-02-26 LAB — CBC
HCT: 35.8 % — ABNORMAL LOW (ref 36.0–46.0)
Hemoglobin: 11.9 g/dL — ABNORMAL LOW (ref 12.0–15.0)
MCH: 29.3 pg (ref 26.0–34.0)
MCHC: 33.2 g/dL (ref 30.0–36.0)
MCV: 88.2 fL (ref 78.0–100.0)
PLATELETS: 256 10*3/uL (ref 150–400)
RBC: 4.06 MIL/uL (ref 3.87–5.11)
RDW: 13.4 % (ref 11.5–15.5)
WBC: 12.8 10*3/uL — ABNORMAL HIGH (ref 4.0–10.5)

## 2017-02-26 LAB — BASIC METABOLIC PANEL
Anion gap: 11 (ref 5–15)
BUN: 16 mg/dL (ref 6–20)
CALCIUM: 9.2 mg/dL (ref 8.9–10.3)
CO2: 26 mmol/L (ref 22–32)
CREATININE: 0.69 mg/dL (ref 0.44–1.00)
Chloride: 101 mmol/L (ref 101–111)
GFR calc non Af Amer: >60 mL/min (ref >60)
Glucose, Bld: 104 mg/dL — ABNORMAL HIGH (ref 65–99)
Potassium: 3.7 mmol/L (ref 3.5–5.1)
SODIUM: 138 mmol/L (ref 135–145)

## 2017-02-26 MED ORDER — METHOCARBAMOL 500 MG PO TABS: 500.0000 mg | ORAL_TABLET | Freq: Four times a day (QID) | ORAL | 0 refills | Status: AC | PRN

## 2017-02-26 MED ORDER — OXYCODONE HCL 10 MG PO TABS: 5.0000 mg | ORAL_TABLET | ORAL | 0 refills | Status: AC | PRN

## 2017-02-26 MED ORDER — ASPIRIN 325 MG PO TBEC: 325.0000 mg | DELAYED_RELEASE_TABLET | Freq: Two times a day (BID) | ORAL | 0 refills | Status: AC

## 2017-02-26 NOTE — Progress Notes (Signed)
Discharged to home. Verbalized understanding of written and verbal discharge instructions. Scripts given for new medications, understand follow up appointment and exercises. Left unit via wheelchair, accompanied by spouse and Interior and spatial designervolunteer transportation technician

## 2017-02-26 NOTE — Progress Notes (Signed)
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 PMH includes R TKA, HTN, and dysrhythmia.     PT Comments    Pt performed increased gait, reviewed stairs and reviewed HEP in prep for d/c home.  PTA issued hand out on stair training with RW.  Informed nursing patient ready to d/c home from a mobility stand point.      Follow Up Recommendations  Home health PT;Supervision/Assistance - 24 hour     Equipment Recommendations  None recommended by PT    Recommendations for Other Services       Precautions / Restrictions Precautions Precautions: Knee Precaution Booklet Issued: Yes (comment) Precaution Comments: Reviewed supine ther ex.  Restrictions Weight Bearing Restrictions: Yes LLE Weight Bearing: Weight bearing as tolerated    Mobility  Bed Mobility Overal bed mobility: Needs Assistance Bed Mobility: Supine to Sit     Supine to sit: Min assist     General bed mobility comments: pt in chair  Transfers Overall transfer level: Needs assistance Equipment used: Rolling walker (2 wheeled) Transfers: Sit to/from UGI CorporationStand;Stand Pivot Transfers Sit to Stand: Supervision Stand pivot transfers: Supervision       General transfer comment: Cues for hand placement and forward advancement of surgical limb.    Ambulation/Gait Ambulation/Gait assistance: Min guard Ambulation Distance (Feet): 115 Feet (x2)   Gait Pattern/deviations: Step-through pattern;Shuffle;Trunk flexed;Step-to pattern;Antalgic Gait velocity: Decreased Gait velocity interpretation: Below normal speed for age/gender General Gait Details: Remains slow and guarded due to pain but able to advance gait distance.  Cues for progression to step through relaxing shoulders and extending knee in stance phase on L knee.     Stairs Stairs: Yes    Stair Management: No rails;With walker Number of Stairs: 3 General stair comments: Cues for RW placement and sequencing.  Min assistance to stedy RW.  Pt issued hand out to review with her husband.  Husband declined education.    Wheelchair Mobility    Modified Rankin (Stroke Patients Only)       Balance Overall balance assessment: Needs assistance   Sitting balance-Leahy Scale: Good       Standing balance-Leahy Scale: Fair                              Cognition Arousal/Alertness: Awake/alert Behavior During Therapy: WFL for tasks assessed/performed Overall Cognitive Status: Within Functional Limits for tasks assessed                                        Exercises Total Joint Exercises Ankle Circles/Pumps: AROM;Both;10 reps;Supine Quad Sets: AROM;Left;10 reps;Supine Towel Squeeze: AROM;Both;10 reps;Supine Short Arc Quad: AROM;Left;10 reps;Supine Heel Slides: AAROM;Left;10 reps;Supine Hip ABduction/ADduction: AAROM;Left;10 reps;Supine Straight Leg Raises: AAROM;Left;10 reps;Supine Long Arc Quad: AROM;Left;10 reps;Seated Goniometric ROM: 69 degrees L knee.      General Comments        Pertinent Vitals/Pain Pain Assessment: 0-10 Pain Score: 4  Pain Location: L knee Pain Descriptors / Indicators: Sore Pain Intervention(s): Repositioned    Home Living Family/patient expects to be discharged to:: Private residence Living Arrangements: Spouse/significant other Available Help at Discharge: Family;Available 24 hours/day Type of Home: House Home Access: Stairs to enter Entrance  Stairs-Rails: Right Home Layout: One level Home Equipment: Bedside commode;Walker - 2 wheels;Other (comment);Cane - quad (CPM )      Prior Function Level of Independence: Independent          PT Goals (current goals can now be found in the care plan section) Acute Rehab PT Goals Patient Stated Goal: to return home  Potential to Achieve Goals:  Good Progress towards PT goals: Progressing toward goals    Frequency    7X/week      PT Plan Current plan remains appropriate    Co-evaluation              AM-PAC PT "6 Clicks" Daily Activity  Outcome Measure  Difficulty turning over in bed (including adjusting bedclothes, sheets and blankets)?: None Difficulty moving from lying on back to sitting on the side of the bed? : None Difficulty sitting down on and standing up from a chair with arms (e.g., wheelchair, bedside commode, etc,.)?: A Little Help needed moving to and from a bed to chair (including a wheelchair)?: A Little Help needed walking in hospital room?: A Little Help needed climbing 3-5 steps with a railing? : A Little 6 Click Score: 20    End of Session Equipment Utilized During Treatment: Gait belt Activity Tolerance: Patient tolerated treatment well Patient left: in chair;with call bell/phone within reach Nurse Communication: Mobility status PT Visit Diagnosis: Other abnormalities of gait and mobility (R26.89);Pain Pain - Right/Left: Left Pain - part of body: Knee     Time: 1610-9604 PT Time Calculation (min) (ACUTE ONLY): 51 min  Charges:  $Gait Training: 23-37 mins $Therapeutic Exercise: 8-22 mins                    G Codes:       Joycelyn Rua, PTA pager [phone]    Heather Mueller 02/26/2017, 2:09 PM

## 2017-02-26 NOTE — Evaluation (Signed)
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 PMH includes R TKA, HTN, and dysrhythmia.    Clinical Impression   OT education complete . Pt has all needed DME and husband will A as needed    Follow Up Recommendations  No OT follow up          Precautions / Restrictions Restrictions Weight Bearing Restrictions: Yes LLE Weight Bearing: Weight bearing as tolerated      Mobility Bed Mobility               General bed mobility comments: pt in chair  Transfers Overall transfer level: Needs assistance Equipment used: Rolling walker (2 wheeled) Transfers: Sit to/from Stand;Stand Pivot Transfers Sit to Stand: Supervision Stand pivot transfers: Supervision                ADL either performed or assessed with clinical judgement   ADL Overall ADL's : Needs assistance/impaired Eating/Feeding: Set up;Sitting   Grooming: Supervision/safety;Standing           Upper Body Dressing : Set up;Sitting   Lower Body Dressing: Minimal assistance;Sit to/from stand   Toilet Transfer: Supervision/safety;RW;Ambulation;Cueing for safety;Comfort height toilet   Toileting- Clothing Manipulation and Hygiene: Supervision/safety;Sit to/from stand;Cueing for safety;Cueing for sequencing   Tub/ Shower Transfer: Education officer, environmentalWalk-in shower Tub/Shower Transfer Details (indicate cue type and reason): verbalized safety   General ADL Comments: educated pt on use of walker bag to A with increased I                  Pertinent Vitals/Pain Pain Score: 4  Pain Descriptors / Indicators: Sore Pain Intervention(s): Repositioned     Hand Dominance Left   Extremity/Trunk Assessment Upper Extremity Assessment Upper Extremity Assessment: Generalized weakness           Communication Communication Communication: No  difficulties   Cognition Arousal/Alertness: Awake/alert Behavior During Therapy: WFL for tasks assessed/performed Overall Cognitive Status: Within Functional Limits for tasks assessed                                                Home Living Family/patient expects to be discharged to:: Private residence Living Arrangements: Spouse/significant other Available Help at Discharge: Family;Available 24 hours/day Type of Home: House Home Access: Stairs to enter Entergy CorporationEntrance Stairs-Number of Steps: 3 Entrance Stairs-Rails: Right Home Layout: One level     Bathroom Shower/Tub: Producer, television/film/videoWalk-in shower   Bathroom Toilet: Standard     Home Equipment: Bedside commode;Walker - 2 wheels;Other (comment);Cane - quad (CPM )          Prior Functioning/Environment Level of Independence: Independent                          OT Goals(Current goals can be found in the care plan section) Acute Rehab OT Goals Patient Stated Goal: to return home  OT Goal Formulation: With patient  OT Frequency:     Barriers to D/C:               AM-PAC PT "6 Clicks" Daily Activity     Outcome Measure Help from another person eating meals?: None Help from another person taking care of  personal grooming?: None Help from another person toileting, which includes using toliet, bedpan, or urinal?: A Little Help from another person bathing (including washing, rinsing, drying)?: A Little Help from another person to put on and taking off regular upper body clothing?: None Help from another person to put on and taking off regular lower body clothing?: A Little 6 Click Score: 21   End of Session Equipment Utilized During Treatment: Rolling walker;Gait belt CPM Left Knee CPM Left Knee: Off Nurse Communication: Mobility status  Activity Tolerance: Patient tolerated treatment well Patient left: in chair;with call bell/phone within reach;with family/visitor present                   Time:  1096-0454 OT Time Calculation (min): 21 min Charges:  OT General Charges $OT Visit: 1 Procedure OT Evaluation $OT Eval Low Complexity: 1 Procedure G-Codes: OT G-codes **NOT FOR INPATIENT CLASS** Functional Assessment Tool Used: Clinical judgement Functional Limitation: Self care Self Care Current Status (U9811): At least 1 percent but less than 20 percent impaired, limited or restricted Self Care Goal Status (B1478): At least 20 percent but less than 40 percent impaired, limited or restricted Self Care Discharge Status [phone]): At least 20 percent but less than 40 percent impaired, limited or restricted   Lise Auer, [place] [phone]  Einar Crow D 02/26/2017, 12:26 PM

## 2017-02-26 NOTE — Care Management Note (Signed)
Case Management Note  Patient Details  Name: Glendon AxeDawn Nelson Bezold MRN: [id number] Date of Birth: 03-17-1951  Subjective/Objective:    66 yr old female s/p left total Knee arthroplasty.                Action/Plan: Case manager spoke with patient and her husband concerning discharge plan and DME needs. Patient was preoperatively setup with [facility] for therapy, no changes. Patient states DME has been delivered to the home and she will have family support at discharge.       Expected Discharge Date:   02/26/17               Expected Discharge Plan:  Home w Home Health Services  In-House Referral:     Discharge planning Services  CM Consult  Post Acute Care Choice:  Home Health, Durable Medical Equipment Choice offered to:  Patient, Spouse  DME Arranged:  3-N-1, Walker rolling, CPM DME Agency:  [employer]  HH Arranged:  PT HH Agency:  [facility] ([facility]  Status of Service:  Completed, signed off  If discussed at [employer] Length of [employer] Meetings, dates discussed:    Additional Comments:  Durenda GuthrieBrady, Axavier Pressley Naomi, RN 02/26/2017, 12:00 PM

## 2017-02-26 NOTE — Care Management Obs Status (Signed)
MEDICARE OBSERVATION STATUS NOTIFICATION   Patient Details  Name: Heather Mueller MRN: [id number] Date of Birth: 1951/06/20   Medicare Observation Status Notification Given:  Yes    Lawerance Sabalebbie Lasheka Kempner, RN 02/26/2017, 11:42 AM

## 2017-02-26 NOTE — Discharge Summary (Signed)
SPORTS MEDICINE & JOINT REPLACEMENT   Georgena Spurling, MD   Laurier Nancy, PA-C [address] [place], [place], [place]  [address]                             [phone]  PATIENT ID: Heather Mueller        MRN:  [id number]          DOB/AGE: 1951/02/22 / 66 y.o.    DISCHARGE SUMMARY  ADMISSION DATE:    02/25/2017 DISCHARGE DATE:   02/26/2017   ADMISSION DIAGNOSIS: primary osteoarthritis left knee    DISCHARGE DIAGNOSIS:  primary osteoarthritis left knee    ADDITIONAL DIAGNOSIS: Active Problems:   S/P total knee replacement  Past Medical History:  Diagnosis Date  . Arthritis   . Dysrhythmia    palpitations 15 yrs ago stress done neg results- no tests since.-has occ palpitations  . GERD (gastroesophageal reflux disease)    occ  . Headache(784.0)   . Hypertension   . PONV (postoperative nausea and vomiting)     PROCEDURE: Procedure(s): LEFT TOTAL KNEE ARTHROPLASTY on 02/25/2017  CONSULTS:    HISTORY:  See H&P in chart  HOSPITAL COURSE:  Heather Mueller is a 66 y.o. admitted on 02/25/2017 and found to have a diagnosis of primary osteoarthritis left knee.  After appropriate laboratory studies were obtained  they were taken to the operating room on 02/25/2017 and underwent Procedure(s): LEFT TOTAL KNEE ARTHROPLASTY.   They were given perioperative antibiotics:  Anti-infectives    Start     Dose/Rate Route Frequency Ordered Stop   02/25/17 1630  clindamycin (CLEOCIN) IVPB 600 mg     600 mg 100 mL/hr over 30 Minutes Intravenous Every 6 hours 02/25/17 1337 02/26/17 0031   02/25/17 0930  clindamycin (CLEOCIN) IVPB 900 mg     900 mg 100 mL/hr over 30 Minutes Intravenous To ShortStay Surgical 02/22/17 0740 02/25/17 1037    .  Patient given tranexamic acid IV or topical and exparel intra-operatively.  Tolerated the procedure well.    POD# 1: Vital signs were stable.  Patient denied Chest pain, shortness of breath, or calf pain.  Patient was started on Lovenox 30 mg  subcutaneously twice daily at 8am.  Consults to PT, OT, and care management were made.  The patient was weight bearing as tolerated.  CPM was placed on the operative leg 0-90 degrees for 6-8 hours a day. When out of the CPM, patient was placed in the foam block to achieve full extension. Incentive spirometry was taught.  Dressing was changed.       POD #2, Continued  PT for ambulation and exercise program.  IV saline locked.  O2 discontinued.    The remainder of the hospital course was dedicated to ambulation and strengthening.   The patient was discharged on 1 Day Post-Op in  Good condition.  Blood products given:none  DIAGNOSTIC STUDIES: Recent vital signs: Patient Vitals for the past 24 hrs:  BP Temp Temp src Pulse Resp SpO2 Height Weight  02/26/17 0530 127/64 99.1 F (37.3 C) Oral 85 16 96 % - -  02/26/17 0032 137/86 98.8 F (37.1 C) Oral 73 16 97 % - -  02/25/17 1956 (!) 156/91 98.6 F (37 C) Oral 87 15 97 % - -  02/25/17 1330 (!) 144/75 97.1 F (36.2 C) Oral 80 16 95 % 5\' 5"  (1.651 m) 104.8 kg (231 lb)  02/25/17 1318 - - -  84 14 95 % - -  02/25/17 1315 139/76 97.5 F (36.4 C) - 80 14 95 % - -       Recent laboratory studies:  Recent Labs  02/26/17 0706  WBC 12.8*  HGB 11.9*  HCT 35.8*  PLT 256    Recent Labs  02/26/17 0706  NA 138  K 3.7  CL 101  CO2 26  BUN 16  CREATININE 0.69  GLUCOSE 104*  CALCIUM 9.2   Lab Results  Component Value Date   INR 0.99 01/29/2014     Recent Radiographic Studies :  No results found.  DISCHARGE INSTRUCTIONS: Discharge Instructions    CPM    Complete by:  As directed    Continuous passive motion machine (CPM):      Use the CPM from 0 to 90 for 4-6 hours per day.      You may increase by 10 per day.  You may break it up into 2 or 3 sessions per day.      Use CPM for 2 weeks or until you are told to stop.   Call MD / Call 911    Complete by:  As directed    If you experience chest pain or shortness of breath, CALL 911  and be transported to the hospital emergency room.  If you develope a fever above 101 F, pus (white drainage) or increased drainage or redness at the wound, or calf pain, call your surgeon's office.   Constipation Prevention    Complete by:  As directed    Drink plenty of fluids.  Prune juice may be helpful.  You may use a stool softener, such as Colace (over the counter) 100 mg twice a day.  Use MiraLax (over the counter) for constipation as needed.   Diet - low sodium heart healthy    Complete by:  As directed    Discharge instructions    Complete by:  As directed    INSTRUCTIONS AFTER JOINT REPLACEMENT   Remove items at home which could result in a fall. This includes throw rugs or furniture in walking pathways ICE to the affected joint every three hours while awake for 30 minutes at a time, for at least the first 3-5 days, and then as needed for pain and swelling.  Continue to use ice for pain and swelling. You may notice swelling that will progress down to the foot and ankle.  This is normal after surgery.  Elevate your leg when you are not up walking on it.   Continue to use the breathing machine you got in the hospital (incentive spirometer) which will help keep your temperature down.  It is common for your temperature to cycle up and down following surgery, especially at night when you are not up moving around and exerting yourself.  The breathing machine keeps your lungs expanded and your temperature down.   DIET:  As you were doing prior to hospitalization, we recommend a well-balanced diet.  DRESSING / WOUND CARE / SHOWERING  Keep the surgical dressing until follow up.  The dressing is water proof, so you can shower without any extra covering.  IF THE DRESSING FALLS OFF or the wound gets wet inside, change the dressing with sterile gauze.  Please use good hand washing techniques before changing the dressing.  Do not use any lotions or creams on the incision until instructed by your  surgeon.    ACTIVITY  Increase activity slowly as tolerated, but follow the  weight bearing instructions below.   No driving for 6 weeks or until further direction given by your physician.  You cannot drive while taking narcotics.  No lifting or carrying greater than 10 lbs. until further directed by your surgeon. Avoid periods of inactivity such as sitting longer than an hour when not asleep. This helps prevent blood clots.  You may return to work once you are authorized by your doctor.     WEIGHT BEARING   Weight bearing as tolerated with assist device (walker, cane, etc) as directed, use it as long as suggested by your surgeon or therapist, typically at least 4-6 weeks.   EXERCISES  Results after joint replacement surgery are often greatly improved when you follow the exercise, range of motion and muscle strengthening exercises prescribed by your doctor. Safety measures are also important to protect the joint from further injury. Any time any of these exercises cause you to have increased pain or swelling, decrease what you are doing until you are comfortable again and then slowly increase them. If you have problems or questions, call your caregiver or physical therapist for advice.   Rehabilitation is important following a joint replacement. After just a few days of immobilization, the muscles of the leg can become weakened and shrink (atrophy).  These exercises are designed to build up the tone and strength of the thigh and leg muscles and to improve motion. Often times heat used for twenty to thirty minutes before working out will loosen up your tissues and help with improving the range of motion but do not use heat for the first two weeks following surgery (sometimes heat can increase post-operative swelling).   These exercises can be done on a training (exercise) mat, on the floor, on a table or on a bed. Use whatever works the best and is most comfortable for you.    Use music or  television while you are exercising so that the exercises are a pleasant break in your day. This will make your life better with the exercises acting as a break in your routine that you can look forward to.   Perform all exercises about fifteen times, three times per day or as directed.  You should exercise both the operative leg and the other leg as well.   Exercises include:   Quad Sets - Tighten up the muscle on the front of the thigh (Quad) and hold for 5-10 seconds.   Straight Leg Raises - With your knee straight (if you were given a brace, keep it on), lift the leg to 60 degrees, hold for 3 seconds, and slowly lower the leg.  Perform this exercise against resistance later as your leg gets stronger.  Leg Slides: Lying on your back, slowly slide your foot toward your buttocks, bending your knee up off the floor (only go as far as is comfortable). Then slowly slide your foot back down until your leg is flat on the floor again.  Angel Wings: Lying on your back spread your legs to the side as far apart as you can without causing discomfort.  Hamstring Strength:  Lying on your back, push your heel against the floor with your leg straight by tightening up the muscles of your buttocks.  Repeat, but this time bend your knee to a comfortable angle, and push your heel against the floor.  You may put a pillow under the heel to make it more comfortable if necessary.   A rehabilitation program following joint replacement surgery can  speed recovery and prevent re-injury in the future due to weakened muscles. Contact your doctor or a physical therapist for more information on knee rehabilitation.    CONSTIPATION  Constipation is defined medically as fewer than three stools per week and severe constipation as less than one stool per week.  Even if you have a regular bowel pattern at home, your normal regimen is likely to be disrupted due to multiple reasons following surgery.  Combination of anesthesia,  postoperative narcotics, change in appetite and fluid intake all can affect your bowels.   YOU MUST use at least one of the following options; they are listed in order of increasing strength to get the job done.  They are all available over the counter, and you may need to use some, POSSIBLY even all of these options:    Drink plenty of fluids (prune juice may be helpful) and high fiber foods Colace 100 mg by mouth twice a day  Senokot for constipation as directed and as needed Dulcolax (bisacodyl), take with full glass of water  Miralax (polyethylene glycol) once or twice a day as needed.  If you have tried all these things and are unable to have a bowel movement in the first 3-4 days after surgery call either your surgeon or your primary doctor.    If you experience loose stools or diarrhea, hold the medications until you stool forms back up.  If your symptoms do not get better within 1 week or if they get worse, check with your doctor.  If you experience "the worst abdominal pain ever" or develop nausea or vomiting, please contact the office immediately for further recommendations for treatment.   ITCHING:  If you experience itching with your medications, try taking only a single pain pill, or even half a pain pill at a time.  You can also use Benadryl over the counter for itching or also to help with sleep.   TED HOSE STOCKINGS:  Use stockings on both legs until for at least 2 weeks or as directed by physician office. They may be removed at night for sleeping.  MEDICATIONS:  See your medication summary on the "After Visit Summary" that nursing will review with you.  You may have some home medications which will be placed on hold until you complete the course of blood thinner medication.  It is important for you to complete the blood thinner medication as prescribed.  PRECAUTIONS:  If you experience chest pain or shortness of breath - call 911 immediately for transfer to the hospital emergency  department.   If you develop a fever greater that 101 F, purulent drainage from wound, increased redness or drainage from wound, foul odor from the wound/dressing, or calf pain - CONTACT YOUR SURGEON.                                                   FOLLOW-UP APPOINTMENTS:  If you do not already have a post-op appointment, please call the office for an appointment to be seen by your surgeon.  Guidelines for how soon to be seen are listed in your "After Visit Summary", but are typically between 1-4 weeks after surgery.  OTHER INSTRUCTIONS:   Knee Replacement:  Do not place pillow under knee, focus on keeping the knee straight while resting. CPM instructions: 0-90 degrees, 2 hours in the  morning, 2 hours in the afternoon, and 2 hours in the evening. Place foam block, curve side up under heel at all times except when in CPM or when walking.  DO NOT modify, tear, cut, or change the foam block in any way.  MAKE SURE YOU:  Understand these instructions.  Get help right away if you are not doing well or get worse.    Thank you for letting us be a part of your medical care team.  It is a privilege we respect greatly.  We hope these instructions will help you stay on track for a fast and full recovery!   Increase activity slowly as tolerated    Complete by:  As directed       DISCHARGE MEDICATIONS:   Allergies as of 02/26/2017      Reactions   Nabumetone Other (See Comments)   Headaches and Heartburn    Bee Venom Swelling   UNSPECIFIED REACTION    Ciprodex [ciprofloxacin-dexamethasone] Rash   Mango Flavor Rash   Penicillins Rash   Tramadol Itching      Medication List    TAKE these medications   amLODipine 5 MG tablet Commonly known as:  NORVASC Take 5 mg by mouth daily.   aspirin 325 MG EC tablet Take 1 tablet (325 mg total) by mouth 2 (two) times daily.   b complex vitamins tablet Take 2 tablets by mouth 3 (three) times daily with meals.   fluorometholone 0.1 % ophthalmic  suspension Commonly known as:  FML Place 1 drop into the left eye 3 (three) times daily as needed.   Glucosamine 500 MG Caps Take 1,000 mg by mouth daily.   losartan 50 MG tablet Commonly known as:  COZAAR Take 50 mg by mouth 2 (two) times daily.   magnesium oxide 400 MG tablet Commonly known as:  MAG-OX Take 400-800 mg by mouth daily.   methocarbamol 500 MG tablet Commonly known as:  ROBAXIN Take 1-2 tablets (500-1,000 mg total) by mouth every 6 (six) hours as needed for muscle spasms.   multivitamin with minerals Tabs tablet Take 3 tablets by mouth daily.   neomycin-polymyxin-hydrocortisone 3.5-10000-1 ophthalmic suspension Commonly known as:  CORTISPORIN 1 drop 3 (three) times daily as needed (Apply to affected ear).   OVER THE COUNTER MEDICATION Take 1 capsule by mouth 2 (two) times daily. Turmeric / resveratrol / omega CoQ10   Oxycodone HCl 10 MG Tabs Take 0.5-1 tablets (5-10 mg total) by mouth every 3 (three) hours as needed for breakthrough pain.   Red Yeast Rice Extract 600 MG Caps Take 1 capsule by mouth daily.   SYSTANE ULTRA OP Place 1 drop into both eyes 3 (three) times daily as needed.   VITAMIN C PO Take 2 tablets by mouth 3 (three) times daily with meals. Each tablet is 600 mg   Vitamin D3 10000 units Tabs Take 1 tablet by mouth daily.            Durable Medical Equipment        Start     Ordered   02/25/17 1337  DME Walker rolling  Once    Question:  Patient needs a walker to treat with the following condition  Answer:  S/P total knee replacement   02/25/17 1337   02/25/17 1337  DME 3 n 1  Once     02/25/17 1337   02/25/17 1337  DME Bedside commode  Once    Question:  Patient needs a bedside commode to  treat with the following condition  Answer:  S/P total knee replacement   02/25/17 1337      FOLLOW UP VISIT:   Follow-up Information    [facility] Follow up.   Specialty:  Home Health Services Why:  A representative from  [facility] will contact you to arrange start date and time for your therapy. Contact information: [address] [place] [address] [place] [address] [phone]           DISPOSITION: HOME VS. SNF  CONDITION:  Good   Guy Sandifer 02/26/2017, 1:04 PM

## 2017-02-26 NOTE — Anesthesia Postprocedure Evaluation (Addendum)
Anesthesia Post Note  Patient: [name]  Procedure(s) Performed: Procedure(s) (LRB): LEFT TOTAL KNEE ARTHROPLASTY (Left)  Patient location during evaluation: PACU Anesthesia Type: Spinal Level of consciousness: responds to stimulation Pain management: pain level controlled Vital Signs Assessment: post-procedure vital signs reviewed and stable Respiratory status: spontaneous breathing, respiratory function stable and patient connected to nasal cannula oxygen Cardiovascular status: blood pressure returned to baseline and stable Postop Assessment: no headache and no backache Anesthetic complications: no       Last Vitals:  Vitals:   02/26/17 0032 02/26/17 0530  BP: 137/86 127/64  Pulse: 73 85  Resp: 16 16  Temp: 37.1 C 37.3 C    Last Pain:  Vitals:   02/26/17 0635  TempSrc:   PainSc: 5                  Yeudiel Mateo,JAMES TERRILL

## 2017-02-26 NOTE — Progress Notes (Signed)
SPORTS MEDICINE AND JOINT REPLACEMENT  Georgena SpurlingStephen Lucey, MD    Laurier Nancyolby Tawfiq Favila, PA-C [address], [place], [place]  [id number]                             [phone]   PROGRESS NOTE  Subjective:  negative for Chest Pain  negative for Shortness of Breath  negative for Nausea/Vomiting   negative for Calf Pain  negative for Bowel Movement   Tolerating Diet: yes         Patient reports pain as 4 on 0-10 scale.    Objective: Vital signs in last 24 hours:   Patient Vitals for the past 24 hrs:  BP Temp Temp src Pulse Resp SpO2 Height Weight  02/26/17 0530 127/64 99.1 F (37.3 C) Oral 85 16 96 % - -  02/26/17 0032 137/86 98.8 F (37.1 C) Oral 73 16 97 % - -  02/25/17 1956 (!) 156/91 98.6 F (37 C) Oral 87 15 97 % - -  02/25/17 1330 (!) 144/75 97.1 F (36.2 C) Oral 80 16 95 % 5\' 5"  (1.651 m) 104.8 kg (231 lb)  02/25/17 1318 - - - 84 14 95 % - -  02/25/17 1315 139/76 97.5 F (36.4 C) - 80 14 95 % - -  02/25/17 1300 139/76 - - 85 13 94 % - -  02/25/17 1245 140/71 - - 90 19 94 % - -  02/25/17 1230 122/65 97.5 F (36.4 C) - 99 15 95 % - -  02/25/17 1003 138/63 - - 73 11 98 % - -  02/25/17 1000 127/61 - - 79 14 97 % - -  02/25/17 0959 127/61 - - 80 12 95 % - -  02/25/17 0945 - - - 80 15 95 % - -  02/25/17 0930 - - - 85 17 98 % - -  02/25/17 0802 (!) 143/70 98.1 F (36.7 C) Oral 74 20 95 % - 104.8 kg (231 lb)    @flow {1959:LAST@   Intake/Output from previous day:   05/07 0701 - 05/08 0700 In: 2210 [I.V.:2000] Out: 1450 [Urine:1400]   Intake/Output this shift:   05/07 1901 - 05/08 0700 In: 50  Out: 500 [Urine:500]   Intake/Output      05/07 0701 - 05/08 0700   I.V. (mL/kg) 2000 (19.1)   IV Piggyback 210   Total Intake(mL/kg) 2210 (21.1)   Urine (mL/kg/hr) 1400   Blood 50   Total Output 1450   Net +760       Urine Occurrence 2 x      LABORATORY DATA: No results for input(s): WBC, HGB, HCT, PLT in the last 168 hours. No results for input(s): NA, K, CL, CO2,  BUN, CREATININE, GLUCOSE, CALCIUM in the last 168 hours. Lab Results  Component Value Date   INR 0.99 01/29/2014    Examination:  General appearance: alert, cooperative and no distress Extremities: extremities normal, atraumatic, no cyanosis or edema  Wound Exam: clean, dry, intact   Drainage:  None: wound tissue dry  Motor Exam: Quadriceps and Hamstrings Intact  Sensory Exam: Superficial Peroneal, Deep Peroneal and Tibial normal   Assessment:    1 Day Post-Op  Procedure(s) (LRB): LEFT TOTAL KNEE ARTHROPLASTY (Left)  ADDITIONAL DIAGNOSIS:  Active Problems:   S/P total knee replacement     Plan: Physical Therapy as ordered Weight Bearing as Tolerated (WBAT)  DVT Prophylaxis:  Aspirin  DISCHARGE PLAN: Home  DISCHARGE NEEDS: HHPT   Patient looks great. Anticipate D/C home today         Guy Sandifer 02/26/2017, 7:00 AM

## 2017-02-26 NOTE — Op Note (Signed)
TOTAL KNEE REPLACEMENT OPERATIVE NOTE:  02/25/2017  2:07 PM  PATIENT:  Heather Mueller  66 y.o. female  PRE-OPERATIVE DIAGNOSIS:  primary osteoarthritis left knee  POST-OPERATIVE DIAGNOSIS:  primary osteoarthritis left knee  PROCEDURE:  Procedure(s): LEFT TOTAL KNEE ARTHROPLASTY  SURGEON:  Surgeon(s): Dannielle HuhLucey, Camilah Spillman, MD  PHYSICIAN ASSISTANT: Laurier Nancyolby Robbins, [facility]   ANESTHESIA:   spinal  DRAINS: Hemovac  SPECIMEN: None  COUNTS:  Correct  TOURNIQUET:   Total Tourniquet Time Documented: Thigh (Left) - 43 minutes Total: Thigh (Left) - 43 minutes   DICTATION:  Indication for procedure:    The patient is a 66 y.o. female who has failed conservative treatment for primary osteoarthritis left knee.  Informed consent was obtained prior to anesthesia. The risks versus benefits of the operation were explain and in a way the patient can, and did, understand.   On the implant demand matching protocol, this patient scored 10.  Therefore, this patient was not receive a polyethylene insert with vitamin E which is a high demand implant.  Description of procedure:     The patient was taken to the operating room and placed under anesthesia.  The patient was positioned in the usual fashion taking care that all body parts were adequately padded and/or protected.  I foley catheter was not placed.  A tourniquet was applied and the leg prepped and draped in the usual sterile fashion.  The extremity was exsanguinated with the esmarch and tourniquet inflated to 350 mmHg.  Pre-operative range of motion was normal.  The knee was in 5 degree of mild varus.  A midline incision approximately 6-7 inches long was made with a #10 blade.  A new blade was used to make a parapatellar arthrotomy going 2-3 cm into the quadriceps tendon, over the patella, and alongside the medial aspect of the patellar tendon.  A synovectomy was then performed with the #10 blade and forceps. I then elevated the deep MCL off the  medial tibial metaphysis subperiosteally around to the semimembranosus attachment.    I everted the patella and used calipers to measure patellar thickness.  I used the reamer to ream down to appropriate thickness to recreate the native thickness.  I then removed excess bone with the rongeur and sagittal saw.  I used the appropriately sized template and drilled the three lug holes.  I then put the trial in place and measured the thickness with the calipers to ensure recreation of the native thickness.  The trial was then removed and the patella subluxed and the knee brought into flexion.  A homan retractor was place to retract and protect the patella and lateral structures.  A Z-retractor was place medially to protect the medial structures.  The extra-medullary alignment system was used to make cut the tibial articular surface perpendicular to the anamotic axis of the tibia and in 3 degrees of posterior slope.  The cut surface and alignment jig was removed.  I then used the intramedullary alignment guide to make a 6 valgus cut on the distal femur.  I then marked out the epicondylar axis on the distal femur.  The posterior condylar axis measured 3 degrees.  I then used the anterior referencing sizer and measured the femur to be a size 7.  The 4-In-1 cutting block was screwed into place in external rotation matching the posterior condylar angle, making our cuts perpendicular to the epicondylar axis.  Anterior, posterior and chamfer cuts were made with the sagittal saw.  The cutting block and cut  pieces were removed.  A lamina spreader was placed in 90 degrees of flexion.  The ACL, PCL, menisci, and posterior condylar osteophytes were removed.  A 10 mm spacer blocked was found to offer good flexion and extension gap balance after moderate in degree releasing.   The scoop retractor was then placed and the femoral finishing block was pinned in place.  The small sagittal saw was used as well as the lug drill to  finish the femur.  The block and cut surfaces were removed and the medullary canal hole filled with autograft bone from the cut pieces.  The tibia was delivered forward in deep flexion and external rotation.  A size D tray was selected and pinned into place centered on the medial 1/3 of the tibial tubercle.  The reamer and keel was used to prepare the tibia through the tray.    I then trialed with the size 7 femur, size D tibia, a 10 mm insert and the 32 patella.  I had excellent flexion/extension gap balance, excellent patella tracking.  Flexion was full and beyond 120 degrees; extension was zero.  These components were chosen and the staff opened them to me on the back table while the knee was lavaged copiously and the cement mixed.  The soft tissue was infiltrated with 60cc of exparel 1.3% through a 21 gauge needle.  I cemented in the components and removed all excess cement.  The polyethylene tibial component was snapped into place and the knee placed in extension while cement was hardening.  The capsule was infilltrated with 30cc of .25% Marcaine with epinephrine.  A hemovac was place in the joint exiting superolaterally.  A pain pump was place superomedially superficial to the arthrotomy.  Once the cement was hard, the tourniquet was let down.  Hemostasis was obtained.  The arthrotomy was closed with figure-8 #1 vicryl sutures.  The deep soft tissues were closed with #0 vicryls and the subcuticular layer closed with a running #2-0 vicryl.  The skin was reapproximated and closed with skin staples.  The wound was dressed with xeroform, 4 x4's, 2 ABD sponges, a single layer of webril and a TED stocking.   The patient was then awakened, extubated, and taken to the recovery room in stable condition.  BLOOD LOSS:  300cc DRAINS: 1 hemovac, 1 pain catheter COMPLICATIONS:  None.  PLAN OF CARE: Admit to inpatient   PATIENT DISPOSITION:  PACU - hemodynamically stable.   Delay start of Pharmacological  VTE agent (>24hrs) due to surgical blood loss or risk of bleeding:  not applicable  Please fax a copy of this op note to my office at [phone] (please only include page 1 and 2 of the Case Information op note)

## 2017-03-22 NOTE — Addendum Note (Signed)
Addendum  created 03/22/17 1442 by Heriberto Stmartin, MD   Sign clinical note    

## 2022-05-16 ENCOUNTER — Emergency Department (HOSPITAL_BASED_OUTPATIENT_CLINIC_OR_DEPARTMENT_OTHER)
Admission: EM | Admit: 2022-05-16 | Discharge: 2022-05-16 | Disposition: A | Discharge status: Home / Self Care | Payer: Medicare Other | Attending: Emergency Medicine | Admitting: Emergency Medicine

## 2022-05-16 ENCOUNTER — Other Ambulatory Visit: Payer: Self-pay

## 2022-05-16 ENCOUNTER — Encounter (HOSPITAL_BASED_OUTPATIENT_CLINIC_OR_DEPARTMENT_OTHER): Payer: Self-pay | Admitting: Pediatrics

## 2022-05-16 ENCOUNTER — Emergency Department (HOSPITAL_BASED_OUTPATIENT_CLINIC_OR_DEPARTMENT_OTHER): Payer: Medicare Other

## 2022-05-16 DIAGNOSIS — R5383 Other fatigue: Secondary | ICD-10-CM | POA: Diagnosis not present

## 2022-05-16 DIAGNOSIS — R002 Palpitations: Secondary | ICD-10-CM | POA: Insufficient documentation

## 2022-05-16 DIAGNOSIS — Z79899 Other long term (current) drug therapy: Secondary | ICD-10-CM | POA: Diagnosis not present

## 2022-05-16 DIAGNOSIS — R0602 Shortness of breath: Secondary | ICD-10-CM | POA: Diagnosis not present

## 2022-05-16 DIAGNOSIS — R079 Chest pain, unspecified: Secondary | ICD-10-CM | POA: Diagnosis not present

## 2022-05-16 DIAGNOSIS — Z7982 Long term (current) use of aspirin: Secondary | ICD-10-CM | POA: Diagnosis not present

## 2022-05-16 LAB — CBC
HCT: 40.5 % (ref 36.0–46.0)
Hemoglobin: 13.8 g/dL (ref 12.0–15.0)
MCH: 31.4 pg (ref 26.0–34.0)
MCHC: 34.1 g/dL (ref 30.0–36.0)
MCV: 92.3 fL (ref 80.0–100.0)
Platelets: 320 10*3/uL (ref 150–400)
RBC: 4.39 MIL/uL (ref 3.87–5.11)
RDW: 12.9 % (ref 11.5–15.5)
WBC: 6.7 10*3/uL (ref 4.0–10.5)
nRBC: 0 % (ref 0.0–0.2)

## 2022-05-16 LAB — BASIC METABOLIC PANEL
Anion gap: 9 (ref 5–15)
BUN: 21 mg/dL (ref 8–23)
CO2: 23 mmol/L (ref 22–32)
Calcium: 9.6 mg/dL (ref 8.9–10.3)
Chloride: 105 mmol/L (ref 98–111)
Creatinine, Ser: 0.78 mg/dL (ref 0.44–1.00)
GFR, Estimated: >60 mL/min (ref >60)
Glucose, Bld: 84 mg/dL (ref 70–99)
Potassium: 4.1 mmol/L (ref 3.5–5.1)
Sodium: 137 mmol/L (ref 135–145)

## 2022-05-16 LAB — TROPONIN I (HIGH SENSITIVITY): Troponin I (High Sensitivity): <2 ng/L (ref <18)

## 2022-05-16 NOTE — Discharge Instructions (Addendum)
You were seen in the emergency department for evaluation of fatigue and palpitations.  You had blood work EKG and chest x-ray that did not show any significant findings.  Please hold your spironolactone for now and follow-up with your primary care doctor.  Return to the emergency department if any worsening or concerning symptoms.

## 2022-05-16 NOTE — ED Provider Notes (Signed)
[facility] EMERGENCY DEPARTMENT Provider Note   CSN: [id number] Arrival date & time: 05/16/22  1523     History {Add pertinent medical, surgical, social history, OB history to HPI:1} Chief Complaint  Patient presents with   Chest Pain   Palpitations    Heather Mueller is a 71 y.o. female.  She has a history of hypertension.  Her dermatologist started her on spironolactone a couple of weeks ago to help with hair growth.  Sunday she noticed some general fatigue.  Today she also felt fatigue along with some feeling of a racing heart and feeling shaky.  She went to her primary care doctor's office and they referred her here for further evaluation.  She said she is feeling a little bit better since she got here.  She did not check her pulse or blood pressure while she was symptomatic.  She said she has been eating and drinking well.  The history is provided by the patient.  Palpitations Palpitations quality:  Fast Onset quality:  Gradual Timing:  Intermittent Progression:  Resolved Chronicity:  New Relieved by:  None tried Worsened by:  Nothing Ineffective treatments:  None tried Associated symptoms: chest pain and shortness of breath   Associated symptoms: no nausea and no vomiting        Home Medications Prior to Admission medications   Medication Sig Start Date End Date Taking? Authorizing Provider  amLODipine (NORVASC) 5 MG tablet Take 5 mg by mouth daily.    [provider]  Ascorbic Acid (VITAMIN C PO) Take 2 tablets by mouth 3 (three) times daily with meals. Each tablet is 600 mg    [provider]  aspirin EC 325 MG EC tablet Take 1 tablet (325 mg total) by mouth 2 (two) times daily. 02/26/17   Guy Sandifer, PA  b complex vitamins tablet Take 2 tablets by mouth 3 (three) times daily with meals.    [provider]  Cholecalciferol (VITAMIN D3) 10000 units TABS Take 1 tablet by mouth daily.    [provider]   fluorometholone (FML) 0.1 % ophthalmic suspension Place 1 drop into the left eye 3 (three) times daily as needed.    [provider]  Glucosamine 500 MG CAPS Take 1,000 mg by mouth daily.    [provider]  losartan (COZAAR) 50 MG tablet Take 50 mg by mouth 2 (two) times daily.    [provider]  magnesium oxide (MAG-OX) 400 MG tablet Take 400-800 mg by mouth daily.     [provider]  methocarbamol (ROBAXIN) 500 MG tablet Take 1-2 tablets (500-1,000 mg total) by mouth every 6 (six) hours as needed for muscle spasms. 02/26/17   Guy Sandifer, PA  Multiple Vitamin (MULTIVITAMIN WITH MINERALS) TABS tablet Take 3 tablets by mouth daily.    [provider]  neomycin-polymyxin-hydrocortisone (CORTISPORIN) 3.5-10000-1 ophthalmic suspension 1 drop 3 (three) times daily as needed (Apply to affected ear).  02/02/15   [provider]  OVER THE COUNTER MEDICATION Take 1 capsule by mouth 2 (two) times daily. Turmeric / resveratrol / omega CoQ10    [provider]  oxyCODONE 10 MG TABS Take 0.5-1 tablets (5-10 mg total) by mouth every 3 (three) hours as needed for breakthrough pain. 02/26/17   Guy Sandifer, PA  Polyethyl Glycol-Propyl Glycol (SYSTANE ULTRA OP) Place 1 drop into both eyes 3 (three) times daily as needed.    [provider]  Red Yeast Rice Extract  600 MG CAPS Take 1 capsule by mouth daily.    [provider]      Allergies    Nabumetone, Bee venom, Ciprodex [ciprofloxacin-dexamethasone], Mango flavor, Penicillins, and Tramadol    Review of Systems   Review of Systems  Constitutional:  Positive for fatigue. Negative for fever.  Eyes:  Negative for visual disturbance.  Respiratory:  Positive for shortness of breath.   Cardiovascular:  Positive for chest pain and palpitations.  Gastrointestinal:  Negative for nausea and vomiting.    Physical Exam Updated Vital Signs BP 140/73 (BP Location: Right  Arm)   Pulse 89   Temp 97.7 F (36.5 C) (Oral)   Resp 20   Ht 5\' 4"  (1.626 m)   Wt 99.8 kg   SpO2 99%   BMI 37.76 kg/m  Physical Exam Vitals and nursing note reviewed.  Constitutional:      General: She is not in acute distress.    Appearance: She is well-developed.  HENT:     Head: Normocephalic and atraumatic.  Eyes:     Conjunctiva/sclera: Conjunctivae normal.  Cardiovascular:     Rate and Rhythm: Normal rate and regular rhythm.     Heart sounds: Normal heart sounds. No murmur heard. Pulmonary:     Effort: Pulmonary effort is normal. No respiratory distress.     Breath sounds: Normal breath sounds.  Abdominal:     Palpations: Abdomen is soft.     Tenderness: There is no abdominal tenderness.  Musculoskeletal:        General: No swelling.     Cervical back: Neck supple.     Right lower leg: No tenderness.     Left lower leg: No tenderness.  Skin:    General: Skin is warm and dry.     Capillary Refill: Capillary refill takes less than 2 seconds.  Neurological:     General: No focal deficit present.     Mental Status: She is alert.     ED Results / Procedures / Treatments   Labs (all labs ordered are listed, but only abnormal results are displayed) Labs Reviewed  BASIC METABOLIC PANEL  CBC  TROPONIN I (HIGH SENSITIVITY)    EKG EKG Interpretation  Date/Time:  Wednesday May 16 2022 15:32:14 EDT Ventricular Rate:  83 PR Interval:  142 QRS Duration: 88 QT Interval:  366 QTC Calculation: 430 R Axis:   -44 Text Interpretation: Normal sinus rhythm with sinus arrhythmia Left axis deviation Anterolateral infarct , age undetermined Abnormal ECG When compared with ECG of 13-Feb-2017 12:26, No significant change since last tracing Confirmed by 15-Feb-2017 [phone]) on 05/16/2022 3:35:25 PM  Radiology DG Chest 2 View  Result Date: 05/16/2022 CLINICAL DATA:  Chest pain, palpitations EXAM: CHEST - 2 VIEW COMPARISON:  11/26/2019 FINDINGS: Heart and mediastinal  contours are within normal limits. No focal opacities or effusions. No acute bony abnormality. IMPRESSION: No active cardiopulmonary disease. Electronically Signed   By: 01/24/2020 M.D.   On: 05/16/2022 15:44    Procedures Procedures  {Document cardiac monitor, telemetry assessment procedure when appropriate:1}  Medications Ordered in ED Medications - No data to display  ED Course/ Medical Decision Making/ A&P                           Medical Decision Making Amount and/or Complexity of Data Reviewed Labs: ordered. Radiology: ordered.  This patient complains of ***; this involves an extensive number of treatment Options and  is a complaint that carries with it a high risk of complications and morbidity. The differential includes ***  I ordered, reviewed and interpreted labs, which included *** I ordered medication *** and reviewed PMP when indicated. I ordered imaging studies which included *** and I independently    visualized and interpreted imaging which showed *** Additional history obtained from *** Previous records obtained and reviewed *** I consulted *** and discussed lab and imaging findings and discussed disposition.  Cardiac monitoring reviewed, *** Social determinants considered, *** Critical Interventions: ***  After the interventions stated above, I reevaluated the patient and found *** Admission and further testing considered, ***    {Document critical care time when appropriate:1} {Document review of labs and clinical decision tools ie heart score, Chads2Vasc2 etc:1}  {Document your independent review of radiology images, and any outside records:1} {Document your discussion with family members, caretakers, and with consultants:1} {Document social determinants of health affecting pt's care:1} {Document your decision making why or why not admission, treatments were needed:1} Final Clinical Impression(s) / ED Diagnoses Final diagnoses:  None    Rx / DC  Orders ED Discharge Orders     None

## 2022-05-16 NOTE — ED Triage Notes (Signed)
Reported was sent by [facility] for chest pain, shaky and palpitations x 2-3 days; reports recent decreased on Spironolactone dose over the weekend.

## 2023-10-24 DIAGNOSIS — Z961 Presence of intraocular lens: Secondary | ICD-10-CM | POA: Diagnosis not present

## 2023-10-24 DIAGNOSIS — Z947 Corneal transplant status: Secondary | ICD-10-CM | POA: Diagnosis not present

## 2023-10-24 DIAGNOSIS — H1131 Conjunctival hemorrhage, right eye: Secondary | ICD-10-CM | POA: Diagnosis not present

## 2023-10-24 DIAGNOSIS — H02042 Spastic entropion of right lower eyelid: Secondary | ICD-10-CM | POA: Diagnosis not present

## 2023-10-24 DIAGNOSIS — H35351 Cystoid macular degeneration, right eye: Secondary | ICD-10-CM | POA: Diagnosis not present

## 2023-11-14 DIAGNOSIS — M9904 Segmental and somatic dysfunction of sacral region: Secondary | ICD-10-CM | POA: Diagnosis not present

## 2023-11-14 DIAGNOSIS — M25551 Pain in right hip: Secondary | ICD-10-CM | POA: Diagnosis not present

## 2023-11-14 DIAGNOSIS — M9903 Segmental and somatic dysfunction of lumbar region: Secondary | ICD-10-CM | POA: Diagnosis not present

## 2023-11-14 DIAGNOSIS — M62830 Muscle spasm of back: Secondary | ICD-10-CM | POA: Diagnosis not present

## 2023-11-14 DIAGNOSIS — M25552 Pain in left hip: Secondary | ICD-10-CM | POA: Diagnosis not present

## 2023-11-14 DIAGNOSIS — M5413 Radiculopathy, cervicothoracic region: Secondary | ICD-10-CM | POA: Diagnosis not present

## 2023-11-19 DIAGNOSIS — Z789 Other specified health status: Secondary | ICD-10-CM | POA: Diagnosis not present

## 2023-11-19 DIAGNOSIS — I10 Essential (primary) hypertension: Secondary | ICD-10-CM | POA: Diagnosis not present

## 2023-11-27 DIAGNOSIS — G4733 Obstructive sleep apnea (adult) (pediatric): Secondary | ICD-10-CM | POA: Diagnosis not present

## 2023-11-28 DIAGNOSIS — H1131 Conjunctival hemorrhage, right eye: Secondary | ICD-10-CM | POA: Diagnosis not present

## 2023-11-28 DIAGNOSIS — H02042 Spastic entropion of right lower eyelid: Secondary | ICD-10-CM | POA: Diagnosis not present

## 2023-11-28 DIAGNOSIS — Z961 Presence of intraocular lens: Secondary | ICD-10-CM | POA: Diagnosis not present

## 2023-11-28 DIAGNOSIS — H35351 Cystoid macular degeneration, right eye: Secondary | ICD-10-CM | POA: Diagnosis not present

## 2023-11-28 DIAGNOSIS — Z947 Corneal transplant status: Secondary | ICD-10-CM | POA: Diagnosis not present

## 2023-12-11 DIAGNOSIS — M62830 Muscle spasm of back: Secondary | ICD-10-CM | POA: Diagnosis not present

## 2023-12-11 DIAGNOSIS — M5413 Radiculopathy, cervicothoracic region: Secondary | ICD-10-CM | POA: Diagnosis not present

## 2023-12-11 DIAGNOSIS — M9903 Segmental and somatic dysfunction of lumbar region: Secondary | ICD-10-CM | POA: Diagnosis not present

## 2023-12-11 DIAGNOSIS — M25552 Pain in left hip: Secondary | ICD-10-CM | POA: Diagnosis not present

## 2023-12-11 DIAGNOSIS — M9904 Segmental and somatic dysfunction of sacral region: Secondary | ICD-10-CM | POA: Diagnosis not present

## 2023-12-11 DIAGNOSIS — M25551 Pain in right hip: Secondary | ICD-10-CM | POA: Diagnosis not present

## 2023-12-23 DIAGNOSIS — B351 Tinea unguium: Secondary | ICD-10-CM | POA: Diagnosis not present

## 2024-01-09 DIAGNOSIS — R7303 Prediabetes: Secondary | ICD-10-CM | POA: Diagnosis not present

## 2024-01-09 DIAGNOSIS — H02042 Spastic entropion of right lower eyelid: Secondary | ICD-10-CM | POA: Diagnosis not present

## 2024-01-09 DIAGNOSIS — H1131 Conjunctival hemorrhage, right eye: Secondary | ICD-10-CM | POA: Diagnosis not present

## 2024-01-09 DIAGNOSIS — Z961 Presence of intraocular lens: Secondary | ICD-10-CM | POA: Diagnosis not present

## 2024-01-09 DIAGNOSIS — E785 Hyperlipidemia, unspecified: Secondary | ICD-10-CM | POA: Diagnosis not present

## 2024-01-09 DIAGNOSIS — I10 Essential (primary) hypertension: Secondary | ICD-10-CM | POA: Diagnosis not present

## 2024-01-09 DIAGNOSIS — H35351 Cystoid macular degeneration, right eye: Secondary | ICD-10-CM | POA: Diagnosis not present

## 2024-01-09 DIAGNOSIS — Z947 Corneal transplant status: Secondary | ICD-10-CM | POA: Diagnosis not present

## 2024-02-06 DIAGNOSIS — M5413 Radiculopathy, cervicothoracic region: Secondary | ICD-10-CM | POA: Diagnosis not present

## 2024-02-06 DIAGNOSIS — M25552 Pain in left hip: Secondary | ICD-10-CM | POA: Diagnosis not present

## 2024-02-06 DIAGNOSIS — M25551 Pain in right hip: Secondary | ICD-10-CM | POA: Diagnosis not present

## 2024-02-06 DIAGNOSIS — M62830 Muscle spasm of back: Secondary | ICD-10-CM | POA: Diagnosis not present

## 2024-02-06 DIAGNOSIS — M9904 Segmental and somatic dysfunction of sacral region: Secondary | ICD-10-CM | POA: Diagnosis not present

## 2024-02-06 DIAGNOSIS — M9903 Segmental and somatic dysfunction of lumbar region: Secondary | ICD-10-CM | POA: Diagnosis not present

## 2024-02-14 DIAGNOSIS — M1611 Unilateral primary osteoarthritis, right hip: Secondary | ICD-10-CM | POA: Diagnosis not present

## 2024-02-14 DIAGNOSIS — M25551 Pain in right hip: Secondary | ICD-10-CM | POA: Diagnosis not present

## 2024-02-24 DIAGNOSIS — G4733 Obstructive sleep apnea (adult) (pediatric): Secondary | ICD-10-CM | POA: Diagnosis not present

## 2024-02-27 DIAGNOSIS — H35351 Cystoid macular degeneration, right eye: Secondary | ICD-10-CM | POA: Diagnosis not present

## 2024-02-27 DIAGNOSIS — Z961 Presence of intraocular lens: Secondary | ICD-10-CM | POA: Diagnosis not present

## 2024-02-27 DIAGNOSIS — H02042 Spastic entropion of right lower eyelid: Secondary | ICD-10-CM | POA: Diagnosis not present

## 2024-02-27 DIAGNOSIS — H1131 Conjunctival hemorrhage, right eye: Secondary | ICD-10-CM | POA: Diagnosis not present

## 2024-02-27 DIAGNOSIS — Z947 Corneal transplant status: Secondary | ICD-10-CM | POA: Diagnosis not present

## 2024-03-04 DIAGNOSIS — I10 Essential (primary) hypertension: Secondary | ICD-10-CM | POA: Diagnosis not present

## 2024-03-04 DIAGNOSIS — Z01810 Encounter for preprocedural cardiovascular examination: Secondary | ICD-10-CM | POA: Diagnosis not present

## 2024-03-04 DIAGNOSIS — Z01818 Encounter for other preprocedural examination: Secondary | ICD-10-CM | POA: Diagnosis not present

## 2024-03-04 DIAGNOSIS — R7303 Prediabetes: Secondary | ICD-10-CM | POA: Diagnosis not present

## 2024-03-04 DIAGNOSIS — M1611 Unilateral primary osteoarthritis, right hip: Secondary | ICD-10-CM | POA: Diagnosis not present

## 2024-03-05 DIAGNOSIS — I340 Nonrheumatic mitral (valve) insufficiency: Secondary | ICD-10-CM | POA: Diagnosis not present

## 2024-03-05 DIAGNOSIS — H35351 Cystoid macular degeneration, right eye: Secondary | ICD-10-CM | POA: Diagnosis not present

## 2024-03-05 DIAGNOSIS — R9431 Abnormal electrocardiogram [ECG] [EKG]: Secondary | ICD-10-CM | POA: Diagnosis not present

## 2024-03-05 DIAGNOSIS — Z9889 Other specified postprocedural states: Secondary | ICD-10-CM | POA: Diagnosis not present

## 2024-03-05 DIAGNOSIS — Z947 Corneal transplant status: Secondary | ICD-10-CM | POA: Diagnosis not present

## 2024-03-05 DIAGNOSIS — I351 Nonrheumatic aortic (valve) insufficiency: Secondary | ICD-10-CM | POA: Diagnosis not present

## 2024-03-05 DIAGNOSIS — H1131 Conjunctival hemorrhage, right eye: Secondary | ICD-10-CM | POA: Diagnosis not present

## 2024-03-05 DIAGNOSIS — Z961 Presence of intraocular lens: Secondary | ICD-10-CM | POA: Diagnosis not present

## 2024-03-05 DIAGNOSIS — H02042 Spastic entropion of right lower eyelid: Secondary | ICD-10-CM | POA: Diagnosis not present

## 2024-03-05 DIAGNOSIS — Z01818 Encounter for other preprocedural examination: Secondary | ICD-10-CM | POA: Diagnosis not present

## 2024-03-06 DIAGNOSIS — Z01810 Encounter for preprocedural cardiovascular examination: Secondary | ICD-10-CM | POA: Diagnosis not present

## 2024-03-09 DIAGNOSIS — M1611 Unilateral primary osteoarthritis, right hip: Secondary | ICD-10-CM | POA: Diagnosis not present

## 2024-03-11 DIAGNOSIS — C50212 Malignant neoplasm of upper-inner quadrant of left female breast: Secondary | ICD-10-CM | POA: Diagnosis not present

## 2024-03-11 DIAGNOSIS — Z170 Estrogen receptor positive status [ER+]: Secondary | ICD-10-CM | POA: Diagnosis not present

## 2024-03-12 DIAGNOSIS — M1611 Unilateral primary osteoarthritis, right hip: Secondary | ICD-10-CM | POA: Diagnosis not present

## 2024-03-12 DIAGNOSIS — M25551 Pain in right hip: Secondary | ICD-10-CM | POA: Diagnosis not present

## 2024-03-17 DIAGNOSIS — M1611 Unilateral primary osteoarthritis, right hip: Secondary | ICD-10-CM | POA: Diagnosis not present

## 2024-03-19 ENCOUNTER — Other Ambulatory Visit: Payer: Self-pay

## 2024-03-19 ENCOUNTER — Ambulatory Visit: Attending: Orthopedic Surgery

## 2024-03-19 DIAGNOSIS — R262 Difficulty in walking, not elsewhere classified: Secondary | ICD-10-CM | POA: Diagnosis not present

## 2024-03-19 DIAGNOSIS — M25551 Pain in right hip: Secondary | ICD-10-CM

## 2024-03-19 NOTE — Therapy (Signed)
 OUTPATIENT PHYSICAL THERAPY LOWER EXTREMITY EVALUATION   Patient Name: Heather Mueller MRN: [id number] DOB:[birth year], 73 y.o., female Today's Date: 03/19/2024  END OF SESSION:  PT End of Session - 03/19/24 0853     Visit Number 1    Date for PT Re-Evaluation 04/02/24    Progress Note Due on Visit 10    PT Start Time 0848    PT Stop Time 0935    PT Time Calculation (min) 47 min    Activity Tolerance Patient tolerated treatment well    Behavior During Therapy WFL for tasks assessed/performed             Past Medical History:  Diagnosis Date   Arthritis    Dysrhythmia    palpitations 15 yrs ago stress done neg results- no tests since.-has occ palpitations   GERD (gastroesophageal reflux disease)    occ   Headache(784.0)    Hypertension    PONV (postoperative nausea and vomiting)    Past Surgical History:  Procedure Laterality Date   ABDOMINAL HYSTERECTOMY     partial   BLADDER SUSPENSION     Hx: x 2   BREAST SURGERY     Lumpectomy x 3 right breast   COLONOSCOPY W/ BIOPSIES AND POLYPECTOMY     TOTAL KNEE ARTHROPLASTY Right 02/08/2014   Procedure: RIGHT TOTAL KNEE ARTHROPLASTY;  Surgeon: Christie Cox, MD;  Location: [facility] OR;  Service: Orthopedics;  Laterality: Right;   TOTAL KNEE ARTHROPLASTY Left 02/25/2017   Procedure: LEFT TOTAL KNEE ARTHROPLASTY;  Surgeon: Christie Cox, MD;  Location: [facility] OR;  Service: Orthopedics;  Laterality: Left;   Patient Active Problem List   Diagnosis Date Noted   S/P total knee replacement 02/25/2017   S/P total knee arthroplasty 02/08/2014    PCP: Brooks Cao, MD  REFERRING PROVIDER: Dessie Flow, MD  REFERRING DIAG: R THA ant approach  THERAPY DIAG:  Difficulty in walking, not elsewhere classified  Pain in right hip  Rationale for Evaluation and Treatment: Rehabilitation  ONSET DATE: 03/17/24  SUBJECTIVE:   SUBJECTIVE STATEMENT: I was in severe pain in my R hip so we called the office and they increased my medication  so now I feel better  PERTINENT HISTORY: DJD R hip , underwent elective R THA  PAIN:  Are you having pain? Yes: NPRS scale: 0 to9 Pain location: R lat, ant, post hip Pain description: aching, deep , throbbing Aggravating factors: getting into bed, walking, weight bearing Relieving factors: meds rest  PRECAUTIONS: Anterior hip  RED FLAGS: None   WEIGHT BEARING RESTRICTIONS: No  FALLS:  Has patient fallen in last 6 months? No  LIVING ENVIRONMENT: Lives with: lives with their spouse Lives in: House/apartment Stairs: Yes: External: 4 steps; on right going up Has following equipment at home: Otho Blitz - 2 wheeled  OCCUPATION: retired  PLOF: Independent with gait, Independent with transfers, and Needs assistance with homemaking  PATIENT GOALS: return to ability to walk enough distance to travel with my husband  NEXT MD VISIT: one month  OBJECTIVE:  Note: Objective measures were completed at Evaluation unless otherwise noted.  DIAGNOSTIC FINDINGS: na  PATIENT SURVEYS:  HOOS, Jr. Score: 15 / 28, Interval Score: 43.335 / 100  COGNITION: Overall cognitive status: Within functional limits for tasks assessed     SENSATION: Reports numb R lat hip  EDEMA:  Mod edema R thigh   POSTURE: forward lean trunk onto walker, rounded shoulders, forward head  PALPATION: No warmth noted R thigh, incision covered by  clean aquacel bandage no evidence of drainage noted  LOWER EXTREMITY ROM:  Passive ROM Right eval Left eval  Hip flexion 90   Hip extension na   Hip abduction 15   Hip adduction 10   Hip internal rotation na   Hip external rotation na   Knee flexion wfl   Knee extension wfl   Ankle dorsiflexion wfl   Ankle plantarflexion wfl   Ankle inversion wfl   Ankle eversion     (Blank rows = wfl)  LOWER EXTREMITY MMT:  MMT Right eval Left eval  Hip flexion 2-   Hip extension 2-   Hip abduction nt   Hip adduction nt   Hip internal rotation nt   Hip external  rotation nt   Knee flexion 4   Knee extension 3-   Ankle dorsiflexion nt   Ankle plantarflexion nt   Ankle inversion nt   Ankle eversion nt    (Blank rows = wfl)    FUNCTIONAL TESTS:  TUG 48 sec Gait speed 0.4 m/sec  GAIT: Distance walked: 65' in clinic with front wheeled walker, shortened step length B, step through pattern.  Utilized facility wheelchair to ride up elevator and come into clinic, leave clinic                                                                                                                               TREATMENT DATE: 03/19/24: PT evaluation, assisted pt into supine, adapted home program, instructed to utilize theraband resistance for R calf pumps, also added long arc quads with theraband assist (blue)  Instructed pt and husband in elevation of R LE over heart in supine for 15 to 20 min intervals, as well as icing for pan management  Reviewed and practiced climbing up/ down one step with rail on R to mimic at home., provided written inst    PATIENT EDUCATION:  Education details: POC, goals, Person educated: Patient and Spouse Education method: Explanation, Demonstration, and Tactile cues Education comprehension: verbalized understanding, returned demonstration, verbal cues required, and tactile cues required  HOME EXERCISE PROGRAM: Provided with printed ex for t band ankle pumps, assisted LAQ,s and for stair climbing  ASSESSMENT:  CLINICAL IMPRESSION: Patient is a 73 y.o. female who was evaluated today for physical therapy as she recovers from R THA, ant approach.  She is only 2 days post op, getting along very well, has edema R thigh, weakness R quads and hip musculature as expected and pain R hip.  All of these deficits are affecting her mobility, and she is requiring assistance for transfers, gait, bed mobility.  She should benefit from skilled PT to address her deficits and help her with maximizing her outcome.  Her surgeon has approved 2 Pt  visits.  OBJECTIVE IMPAIRMENTS: decreased activity tolerance, decreased balance, decreased endurance, decreased knowledge of condition, decreased mobility, difficulty walking, decreased ROM, decreased strength, postural dysfunction, obesity, and pain.   ACTIVITY LIMITATIONS: carrying,  lifting, bending, standing, squatting, sleeping, stairs, transfers, bed mobility, self feeding, and locomotion level  PARTICIPATION LIMITATIONS: meal prep, cleaning, laundry, driving, shopping, community activity, yard work, and school  PERSONAL FACTORS: Age, Past/current experiences, Time since onset of injury/illness/exacerbation, and 1-2 comorbidities: h/o B TKA, B corneal implants are also affecting patient's functional outcome.   REHAB POTENTIAL: Good  CLINICAL DECISION MAKING: Evolving/moderate complexity  EVALUATION COMPLEXITY: Moderate   GOALS: Goals reviewed with patient? Yes  SHORT TERM GOALS: Target date: 2 weeks 04/02/24 I HEP Baseline: Goal status: INITIAL  LONG TERM GOALS: Target date: 04/02/24:   TUG score 24 sec or less Baseline: 48 sec Goal status: INITIAL  2.  Gait speed 1 .0 me/sec or greater Baseline: 0.4.sec Goal status: INITIAL 3.  HOOS Jr improve from 15/28 to 9/28 Baseline:  Goal status: INITIAL   PLAN:  PT FREQUENCY: 1x/week  PT DURATION: 8 weeks  PLANNED INTERVENTIONS: 97110-Therapeutic exercises, 97530- Therapeutic activity, 97112- Neuromuscular re-education, 97535- Self Care, and 16109- Manual therapy  PLAN FOR NEXT SESSION: assess gait, strength, ROM R hip, pain, incision,progress therex, pt approved for 2 visit by surgeon so will reassess next visit.   Mykala Mccready L Mellany Dinsmore, PT, DPT, OCS 03/19/2024, 5:44 PM  .

## 2024-03-24 ENCOUNTER — Ambulatory Visit

## 2024-03-26 ENCOUNTER — Other Ambulatory Visit: Payer: Self-pay

## 2024-03-26 ENCOUNTER — Ambulatory Visit: Attending: Orthopedic Surgery

## 2024-03-26 DIAGNOSIS — R262 Difficulty in walking, not elsewhere classified: Secondary | ICD-10-CM | POA: Insufficient documentation

## 2024-03-26 DIAGNOSIS — M25551 Pain in right hip: Secondary | ICD-10-CM | POA: Insufficient documentation

## 2024-03-26 NOTE — Therapy (Signed)
 OUTPATIENT PHYSICAL THERAPY DISCHARGE SUMMARY   Patient Name: Heather Mueller MRN: [id number] DOB:02-Dec-1950, 73 y.o., female Today's Date: 03/26/2024  END OF SESSION:  PT End of Session - 03/26/24 1214     Visit Number 2    Date for PT Re-Evaluation 04/02/24    Progress Note Due on Visit 10    PT Start Time 0932    PT Stop Time 1018    PT Time Calculation (min) 46 min    Activity Tolerance Patient tolerated treatment well    Behavior During Therapy WFL for tasks assessed/performed              Past Medical History:  Diagnosis Date   Arthritis    Dysrhythmia    palpitations 15 yrs ago stress done neg results- no tests since.-has occ palpitations   GERD (gastroesophageal reflux disease)    occ   Headache(784.0)    Hypertension    PONV (postoperative nausea and vomiting)    Past Surgical History:  Procedure Laterality Date   ABDOMINAL HYSTERECTOMY     partial   BLADDER SUSPENSION     Hx: x 2   BREAST SURGERY     Lumpectomy x 3 right breast   COLONOSCOPY W/ BIOPSIES AND POLYPECTOMY     TOTAL KNEE ARTHROPLASTY Right 02/08/2014   Procedure: RIGHT TOTAL KNEE ARTHROPLASTY;  Surgeon: Christie Cox, MD;  Location: [facility];  Service: Orthopedics;  Laterality: Right;   TOTAL KNEE ARTHROPLASTY Left 02/25/2017   Procedure: LEFT TOTAL KNEE ARTHROPLASTY;  Surgeon: Christie Cox, MD;  Location: [facility];  Service: Orthopedics;  Laterality: Left;   Patient Active Problem List   Diagnosis Date Noted   S/P total knee replacement 02/25/2017   S/P total knee arthroplasty 02/08/2014    PCP: Brooks Cao, MD  REFERRING PROVIDER: Dessie Flow, MD  REFERRING DIAG: R THA ant approach  THERAPY DIAG:  Difficulty in walking, not elsewhere classified  Pain in right hip  Rationale for Evaluation and Treatment: Rehabilitation  ONSET DATE: 03/17/24  SUBJECTIVE:   SUBJECTIVE STATEMENT: I have had a god week, today was actually the second time I got out of the house since surgery. I  did walk one day with a quad cane this past week but then went back to walker. There is a place where my underwear caught on the lower part of my incision and there is a stiff plastic wire that appears to be pulling out, I wondered if you could look at it. Some pain /difficulty with lifting R leg onto bed hurts unless I use my L leg to help  PERTINENT HISTORY: DJD R hip , underwent elective R THA  PAIN:  Are you having pain? Yes: NPRS scale: 0 to9 Pain location: R lat, ant, post hip Pain description: aching, deep , throbbing Aggravating factors: getting into bed, walking, weight bearing Relieving factors: meds rest  PRECAUTIONS: Anterior hip  RED FLAGS: None   WEIGHT BEARING RESTRICTIONS: No  FALLS:  Has patient fallen in last 6 months? No  LIVING ENVIRONMENT: Lives with: lives with their spouse Lives in: House/apartment Stairs: Yes: External: 4 steps; on right going up Has following equipment at home: Otho Blitz - 2 wheeled  OCCUPATION: retired  PLOF: Independent with gait, Independent with transfers, and Needs assistance with homemaking  PATIENT GOALS: return to ability to walk enough distance to travel with my husband  NEXT MD VISIT: one month  OBJECTIVE:  Note: Objective measures were completed at Evaluation unless otherwise noted.  DIAGNOSTIC FINDINGS: na  PATIENT SURVEYS:  HOOS, Jr. Score: 15 / 28, Interval Score: 43.335 / 100  COGNITION: Overall cognitive status: Within functional limits for tasks assessed     SENSATION: Reports numb R lat hip  EDEMA:  Mod edema R thigh   POSTURE: forward lean trunk onto walker, rounded shoulders, forward head  PALPATION: No warmth noted R thigh, incision covered by clean aquacel bandage no evidence of drainage noted  LOWER EXTREMITY ROM:  Passive ROM Right eval Left eval  Hip flexion 90   Hip extension na   Hip abduction 15   Hip adduction 10   Hip internal rotation na   Hip external rotation na   Knee flexion  wfl   Knee extension wfl   Ankle dorsiflexion wfl   Ankle plantarflexion wfl   Ankle inversion wfl   Ankle eversion     (Blank rows = wfl)  LOWER EXTREMITY MMT:  MMT Right eval Left eval R 03/26/24  Hip flexion 2-  3-  Hip extension 2-  2-  Hip abduction nt    Hip adduction nt    Hip internal rotation nt    Hip external rotation nt    Knee flexion 4  5  Knee extension 3-  5  Ankle dorsiflexion nt    Ankle plantarflexion nt    Ankle inversion nt    Ankle eversion nt     (Blank rows = wfl)    FUNCTIONAL TESTS:  TUG 48 sec Gait speed 0.4 m/sec  GAIT: Distance walked: 47' in clinic with front wheeled walker, shortened step length B, step through pattern.  Utilized facility wheelchair to ride up elevator and come into clinic, leave clinic                                                                                                                               TREATMENT DATE:  03/26/24:  Observed incision R ant hip, pt and husband have removed the aquacel dressing  has horizontal strips across length of incision  line with a plastic type center, 3 of those lines were pulled off the lateral part but no separation or trauma to incision.  Reinforced/taped the strips down on lateral border with small piece of kinesiotape.  Husband stated they will call MD if further difficulty.    MMT: R Seated long arc quad 5/5 Supine heel slides: performed well 10 reps Added for home: Supine bridging 10x Standing heel/toe rocks 10x Standing R LE SLR 10x  Gait with st cane x 50' x 2 with excellent step through gait pattern, some antalgia R.   Education in other strategies to assist with pacing R leg on bed, using st cane, or strap, pt using L LE well.   Education with pt and husband to encourage routine to gradually develop endurance, to walk in their cul de sac 5 min, then gradually increase distance  We did attempt to get onto  recumbent cycle as pt has one at home, but she could not  tolerate the seated position yet, so deferred for later.   03/19/24: PT evaluation, assisted pt into supine, adapted home program, instructed to utilize theraband resistance for R calf pumps, also added long arc quads with theraband assist (blue)  Instructed pt and husband in elevation of R LE over heart in supine for 15 to 20 min intervals, as well as icing for pan management  Reviewed and practiced climbing up/ down one step with rail on R to mimic at home., provided written inst    PATIENT EDUCATION:  Education details: POC, goals, Person educated: Patient and Spouse Education method: Explanation, Demonstration, and Tactile cues Education comprehension: verbalized understanding, returned demonstration, verbal cues required, and tactile cues required  HOME EXERCISE PROGRAM: Provided with printed ex for t band ankle pumps, assisted LAQ,s and for stair climbing  ASSESSMENT:  CLINICAL IMPRESSION: Patient is a 73 y.o. female who participated in her second and final physical therapy session today, fas she recovers from R THA, ant approach.  She is now 8 days post op, getting along very well, has less edema R thigh, R quads strength improved, pain R ant hip particularly when trying to use hip flexors, as expected, advised her and husband that ant hip with get progressively better over the next several weeks.  She is using front wheeled walker, to transition fromit when she feels safe to do so, did practice with a st cane without difficulty today.  She and husband are aware that today is final visit.  DC at this time.   OBJECTIVE IMPAIRMENTS: decreased activity tolerance, decreased balance, decreased endurance, decreased knowledge of condition, decreased mobility, difficulty walking, decreased ROM, decreased strength, postural dysfunction, obesity, and pain.   ACTIVITY LIMITATIONS: carrying, lifting, bending, standing, squatting, sleeping, stairs, transfers, bed mobility, self feeding, and  locomotion level  PARTICIPATION LIMITATIONS: meal prep, cleaning, laundry, driving, shopping, community activity, yard work, and school  PERSONAL FACTORS: Age, Past/current experiences, Time since onset of injury/illness/exacerbation, and 1-2 comorbidities: h/o B TKA, B corneal implants are also affecting patient's functional outcome.   REHAB POTENTIAL: Good  CLINICAL DECISION MAKING: Evolving/moderate complexity  EVALUATION COMPLEXITY: Moderate   GOALS: Goals reviewed with patient? Yes  SHORT TERM GOALS: Target date: 2 weeks 04/02/24 I HEP Baseline: Goal status: INITIAL  LONG TERM GOALS: Target date: 04/02/24:   TUG score 24 sec or less Baseline: 48 sec Goal status: 03/25/24: 28 sec improved  2.  Gait speed 1 .0 me/sec or greater Baseline: 0.4.sec Goal status: 03/25/24: 0.78 sec   3.  HOOS Jr improve from 15/28 to 9/28 Baseline:  Goal status: MET 03/25/24: HOOS, Jr. Score: 9 / 28, Interval Score: 61.815 / 100   PLAN:  PT FREQUENCY: 1x/week  PT DURATION: 8 weeks  PLANNED INTERVENTIONS: 97110-Therapeutic exercises, 97530- Therapeutic activity, 97112- Neuromuscular re-education, 97535- Self Care, and 54098- Manual therapy  PLAN FOR NEXT SESSION: DC today per surgeon's orders  Heather Mueller, PT, DPT, OCS 03/26/2024, 12:19 PM  .

## 2024-03-30 DIAGNOSIS — Z96641 Presence of right artificial hip joint: Secondary | ICD-10-CM | POA: Diagnosis not present

## 2024-04-07 ENCOUNTER — Other Ambulatory Visit: Payer: Self-pay

## 2024-04-07 ENCOUNTER — Ambulatory Visit

## 2024-04-07 DIAGNOSIS — M25551 Pain in right hip: Secondary | ICD-10-CM | POA: Diagnosis not present

## 2024-04-07 DIAGNOSIS — R262 Difficulty in walking, not elsewhere classified: Secondary | ICD-10-CM | POA: Diagnosis not present

## 2024-04-07 NOTE — Therapy (Signed)
 OUTPATIENT PHYSICAL THERAPY PROGRESS NOTE Progress Note Reporting Period 03/19/24 to 04/07/24  See note below for Objective Data and Assessment of Progress/Goals.       Patient Name: Heather Mueller MRN: [id number] DOB:[birth year], 73 y.o., female Today's Date: 04/07/2024  END OF SESSION:  PT End of Session - 04/07/24 1656     Visit Number 3    Date for PT Re-Evaluation 05/01/24    Progress Note Due on Visit 10    PT Start Time 1446    PT Stop Time 1530    PT Time Calculation (min) 44 min    Activity Tolerance Patient tolerated treatment well    Behavior During Therapy WFL for tasks assessed/performed           Past Medical History:  Diagnosis Date   Arthritis    Dysrhythmia    palpitations 15 yrs ago stress done neg results- no tests since.-has occ palpitations   GERD (gastroesophageal reflux disease)    occ   Headache(784.0)    Hypertension    PONV (postoperative nausea and vomiting)    Past Surgical History:  Procedure Laterality Date   ABDOMINAL HYSTERECTOMY     partial   BLADDER SUSPENSION     Hx: x 2   BREAST SURGERY     Lumpectomy x 3 right breast   COLONOSCOPY W/ BIOPSIES AND POLYPECTOMY     TOTAL KNEE ARTHROPLASTY Right 02/08/2014   Procedure: RIGHT TOTAL KNEE ARTHROPLASTY;  Surgeon: Christie Cox, MD;  Location: [facility];  Service: Orthopedics;  Laterality: Right;   TOTAL KNEE ARTHROPLASTY Left 02/25/2017   Procedure: LEFT TOTAL KNEE ARTHROPLASTY;  Surgeon: Christie Cox, MD;  Location: [facility];  Service: Orthopedics;  Laterality: Left;   Patient Active Problem List   Diagnosis Date Noted   S/P total knee replacement 02/25/2017   S/P total knee arthroplasty 02/08/2014    PCP: Brooks Cao, MD  REFERRING PROVIDER: Dessie Flow, MD  REFERRING DIAG: R THA ant approach  THERAPY DIAG:  Difficulty in walking, not elsewhere classified  Pain in right hip  Rationale for Evaluation and Treatment: Rehabilitation  ONSET DATE: 03/17/24  SUBJECTIVE:    SUBJECTIVE STATEMENT: I feel like I've turned a corner this week, suddenly less painful R hip.  We saw the PA and they referred me back for another 4 visits ,to work on my gait.  I'm using the walker when I come out of the house, and the cane indoors. Still taking the pain med in the am, I feel like I need it then   PERTINENT HISTORY: DJD R hip , underwent elective R THA  PAIN:  Are you having pain? Yes: NPRS scale: 0 to9 Pain location: R lat, ant, post hip Pain description: aching, deep , throbbing Aggravating factors: getting into bed, walking, weight bearing Relieving factors: meds rest  PRECAUTIONS: Anterior hip  RED FLAGS: None   WEIGHT BEARING RESTRICTIONS: No  FALLS:  Has patient fallen in last 6 months? No  LIVING ENVIRONMENT: Lives with: lives with their spouse Lives in: House/apartment Stairs: Yes: External: 4 steps; on right going up Has following equipment at home: Otho Blitz - 2 wheeled  OCCUPATION: retired  PLOF: Independent with gait, Independent with transfers, and Needs assistance with homemaking  PATIENT GOALS: return to ability to walk enough distance to travel with my husband  NEXT MD VISIT: one month  OBJECTIVE:  Note: Objective measures were completed at Evaluation unless otherwise noted.  DIAGNOSTIC FINDINGS: na  PATIENT SURVEYS:  HOOS,  Jr. Score: 15 / 28, Interval Score: 43.335 / 100  COGNITION: Overall cognitive status: Within functional limits for tasks assessed     SENSATION: Reports numb R lat hip  EDEMA:  Mod edema R thigh   POSTURE: forward lean trunk onto walker, rounded shoulders, forward head  PALPATION: No warmth noted R thigh, incision covered by clean aquacel bandage no evidence of drainage noted  LOWER EXTREMITY ROM:  Passive ROM Right eval Left eval  Hip flexion 90   Hip extension na   Hip abduction 15   Hip adduction 10   Hip internal rotation na   Hip external rotation na   Knee flexion wfl   Knee extension  wfl   Ankle dorsiflexion wfl   Ankle plantarflexion wfl   Ankle inversion wfl   Ankle eversion     (Blank rows = wfl)  LOWER EXTREMITY MMT:  MMT Right eval Left eval R 03/26/24  Hip flexion 2-  3-  Hip extension 2-  2-  Hip abduction nt    Hip adduction nt    Hip internal rotation nt    Hip external rotation nt    Knee flexion 4  5  Knee extension 3-  5  Ankle dorsiflexion nt    Ankle plantarflexion nt    Ankle inversion nt    Ankle eversion nt     (Blank rows = wfl)    FUNCTIONAL TESTS:  TUG 48 sec Gait speed 0.4 m/sec  GAIT: Distance walked: 57' in clinic with front wheeled walker, shortened step length B, step through pattern.  Utilized facility wheelchair to ride up elevator and come into clinic, leave clinic                                                                                                                               TREATMENT DATE:  04/07/24:  Reassessment of functional mobility, goals B knee ext 5#, 2 x 15 B knee flexion 25#, 2 x 15 Nustep level 5, x 7 min ue and le's Standing on airex at sink for heel/toe rocks Standing with L hand on sink for mid range marches, slow movement Gait with front wheeled walker in clinic Education throughout of purpose of each activity , safety precautions , fall prevention  03/26/24:  Observed incision R ant hip, pt and husband have removed the aquacel dressing  has horizontal strips across length of incision  line with a plastic type center, 3 of those lines were pulled off the lateral part but no separation or trauma to incision.  Reinforced/taped the strips down on lateral border with small piece of kinesiotape.  Husband stated they will call MD if further difficulty.    MMT: R Seated long arc quad 5/5 Supine heel slides: performed well 10 reps Added for home: Supine bridging 10x Standing heel/toe rocks 10x Standing R LE SLR 10x  Gait with st cane x 50' x 2 with excellent step through  gait pattern, some  antalgia R.   Education in other strategies to assist with pacing R leg on bed, using st cane, or strap, pt using L LE well.   Education with pt and husband to encourage routine to gradually develop endurance, to walk in their cul de sac 5 min, then gradually increase distance  We did attempt to get onto recumbent cycle as pt has one at home, but she could not tolerate the seated position yet, so deferred for later.   03/19/24: PT evaluation, assisted pt into supine, adapted home program, instructed to utilize theraband resistance for R calf pumps, also added long arc quads with theraband assist (blue)  Instructed pt and husband in elevation of R LE over heart in supine for 15 to 20 min intervals, as well as icing for pan management  Reviewed and practiced climbing up/ down one step with rail on R to mimic at home., provided written inst    PATIENT EDUCATION:  Education details: POC, goals, Person educated: Patient and Spouse Education method: Explanation, Demonstration, and Tactile cues Education comprehension: verbalized understanding, returned demonstration, verbal cues required, and tactile cues required  HOME EXERCISE PROGRAM: Provided with printed ex for t band ankle pumps, assisted LAQ,s and for stair climbing  ASSESSMENT:  CLINICAL IMPRESSION: Patient is a 73 y.o. female who participated in her 3rd physical therapy session today, as she recovers from R THA, ant approach.  She was referred back to PT by orthopedist for 4 additional visits.  She is now 3 weeks post op, getting along very well,She is using front wheeled walker, outdoors and st cane indoors.  She has some balance and endurance deficits which are affecting her mobility and independence, so will  address these deficits specifically over the remaining 3 PT visits.    OBJECTIVE IMPAIRMENTS: decreased activity tolerance, decreased balance, decreased endurance, decreased knowledge of condition, decreased mobility,  difficulty walking, decreased ROM, decreased strength, postural dysfunction, obesity, and pain.   ACTIVITY LIMITATIONS: carrying, lifting, bending, standing, squatting, sleeping, stairs, transfers, bed mobility, self feeding, and locomotion level  PARTICIPATION LIMITATIONS: meal prep, cleaning, laundry, driving, shopping, community activity, yard work, and school  PERSONAL FACTORS: Age, Past/current experiences, Time since onset of injury/illness/exacerbation, and 1-2 comorbidities: h/o B TKA, B corneal implants are also affecting patient's functional outcome.   REHAB POTENTIAL: Good  CLINICAL DECISION MAKING: Evolving/moderate complexity  EVALUATION COMPLEXITY: Moderate   GOALS: Goals reviewed with patient? Yes  SHORT TERM GOALS: Target date: 2 weeks 04/02/24 I HEP Baseline: Goal status: INITIAL  LONG TERM GOALS: Target date: 04/02/24: extended today through 04/23/24:  TUG score 24 sec or less Baseline: 48 sec Goal status: 03/25/24: 28 sec improved 04/07/24: 15.82 with walker   2.  Gait speed 1 .0 me/sec or greater Baseline: 0.4.sec Goal status: 03/25/24: 0.78 sec  04/07/24: 0.62 m/sec  3.  HOOS Jr improve from 15/28 to 9/28 Baseline:  Goal status: MET 03/25/24: HOOS, Jr. Score: 9 / 28, Interval Score: 61.815 / 100 04/07/24:NA today   PLAN:  PT FREQUENCY: 1x/week  PT DURATION: 8 weeks  PLANNED INTERVENTIONS: 97110-Therapeutic exercises, 97530- Therapeutic activity, 97112- Neuromuscular re-education, 97535- Self Care, and 15176- Manual therapy  PLAN FOR NEXT SESSION: will continue for an additional 1 1/2 to 3 weeks per new orders from MD and focus on endurance, functional mobility and efficiency  Daulton Harbaugh L Christen Bedoya, PT, DPT, OCS 04/07/2024, 4:59 PM  .

## 2024-04-16 DIAGNOSIS — H35351 Cystoid macular degeneration, right eye: Secondary | ICD-10-CM | POA: Diagnosis not present

## 2024-04-16 DIAGNOSIS — Z947 Corneal transplant status: Secondary | ICD-10-CM | POA: Diagnosis not present

## 2024-04-16 DIAGNOSIS — Z961 Presence of intraocular lens: Secondary | ICD-10-CM | POA: Diagnosis not present

## 2024-04-16 DIAGNOSIS — Z9889 Other specified postprocedural states: Secondary | ICD-10-CM | POA: Diagnosis not present

## 2024-04-16 DIAGNOSIS — H1131 Conjunctival hemorrhage, right eye: Secondary | ICD-10-CM | POA: Diagnosis not present

## 2024-04-16 DIAGNOSIS — H02042 Spastic entropion of right lower eyelid: Secondary | ICD-10-CM | POA: Diagnosis not present

## 2024-04-21 ENCOUNTER — Encounter

## 2024-04-28 ENCOUNTER — Encounter

## 2024-04-30 DIAGNOSIS — M62830 Muscle spasm of back: Secondary | ICD-10-CM | POA: Diagnosis not present

## 2024-04-30 DIAGNOSIS — M5413 Radiculopathy, cervicothoracic region: Secondary | ICD-10-CM | POA: Diagnosis not present

## 2024-04-30 DIAGNOSIS — M25551 Pain in right hip: Secondary | ICD-10-CM | POA: Diagnosis not present

## 2024-04-30 DIAGNOSIS — M25552 Pain in left hip: Secondary | ICD-10-CM | POA: Diagnosis not present

## 2024-04-30 DIAGNOSIS — M9904 Segmental and somatic dysfunction of sacral region: Secondary | ICD-10-CM | POA: Diagnosis not present

## 2024-04-30 DIAGNOSIS — M9903 Segmental and somatic dysfunction of lumbar region: Secondary | ICD-10-CM | POA: Diagnosis not present

## 2024-05-20 DIAGNOSIS — D485 Neoplasm of uncertain behavior of skin: Secondary | ICD-10-CM | POA: Diagnosis not present

## 2024-05-20 DIAGNOSIS — Z129 Encounter for screening for malignant neoplasm, site unspecified: Secondary | ICD-10-CM | POA: Diagnosis not present

## 2024-05-20 DIAGNOSIS — D2262 Melanocytic nevi of left upper limb, including shoulder: Secondary | ICD-10-CM | POA: Diagnosis not present

## 2024-05-20 DIAGNOSIS — L821 Other seborrheic keratosis: Secondary | ICD-10-CM | POA: Diagnosis not present

## 2024-05-20 DIAGNOSIS — L738 Other specified follicular disorders: Secondary | ICD-10-CM | POA: Diagnosis not present

## 2024-05-20 DIAGNOSIS — D225 Melanocytic nevi of trunk: Secondary | ICD-10-CM | POA: Diagnosis not present

## 2024-05-27 DIAGNOSIS — G4733 Obstructive sleep apnea (adult) (pediatric): Secondary | ICD-10-CM | POA: Diagnosis not present

## 2024-07-06 DIAGNOSIS — G4733 Obstructive sleep apnea (adult) (pediatric): Secondary | ICD-10-CM | POA: Diagnosis not present

## 2024-07-10 DIAGNOSIS — Z13820 Encounter for screening for osteoporosis: Secondary | ICD-10-CM | POA: Diagnosis not present

## 2024-07-13 DIAGNOSIS — I10 Essential (primary) hypertension: Secondary | ICD-10-CM | POA: Diagnosis not present

## 2024-07-13 DIAGNOSIS — M8580 Other specified disorders of bone density and structure, unspecified site: Secondary | ICD-10-CM | POA: Diagnosis not present

## 2024-07-13 DIAGNOSIS — R7303 Prediabetes: Secondary | ICD-10-CM | POA: Diagnosis not present

## 2024-07-13 DIAGNOSIS — Z0000 Encounter for general adult medical examination without abnormal findings: Secondary | ICD-10-CM | POA: Diagnosis not present

## 2024-07-13 DIAGNOSIS — E041 Nontoxic single thyroid nodule: Secondary | ICD-10-CM | POA: Diagnosis not present

## 2024-07-13 DIAGNOSIS — E785 Hyperlipidemia, unspecified: Secondary | ICD-10-CM | POA: Diagnosis not present

## 2024-07-13 DIAGNOSIS — Z23 Encounter for immunization: Secondary | ICD-10-CM | POA: Diagnosis not present

## 2024-07-20 DIAGNOSIS — Z1231 Encounter for screening mammogram for malignant neoplasm of breast: Secondary | ICD-10-CM | POA: Diagnosis not present

## 2024-07-21 DIAGNOSIS — E041 Nontoxic single thyroid nodule: Secondary | ICD-10-CM | POA: Diagnosis not present
